# Patient Record
Sex: Female | Born: 1935 | Hispanic: No | Marital: Married
Health system: Southern US, Community
[De-identification: ages and names within clinical notes are randomized; demographics above are authoritative.]

---

## 2003-11-23 ENCOUNTER — Ambulatory Visit: Payer: Self-pay | Admitting: Family Medicine

## 2004-02-20 ENCOUNTER — Ambulatory Visit: Payer: Self-pay | Admitting: Internal Medicine

## 2004-07-05 ENCOUNTER — Other Ambulatory Visit: Payer: Self-pay

## 2004-07-05 ENCOUNTER — Emergency Department: Payer: Self-pay | Admitting: Internal Medicine

## 2004-12-26 ENCOUNTER — Ambulatory Visit: Payer: Self-pay | Admitting: Family Medicine

## 2005-12-30 ENCOUNTER — Ambulatory Visit: Payer: Self-pay | Admitting: Family Medicine

## 2006-06-01 ENCOUNTER — Emergency Department: Payer: Self-pay | Admitting: Emergency Medicine

## 2007-12-14 ENCOUNTER — Ambulatory Visit: Payer: Self-pay | Admitting: Family Medicine

## 2008-07-31 ENCOUNTER — Emergency Department: Payer: Self-pay | Admitting: Emergency Medicine

## 2008-08-05 ENCOUNTER — Emergency Department: Payer: Self-pay | Admitting: Emergency Medicine

## 2008-09-19 ENCOUNTER — Inpatient Hospital Stay: Payer: Self-pay | Admitting: Internal Medicine

## 2008-09-19 ENCOUNTER — Ambulatory Visit: Payer: Self-pay | Admitting: Internal Medicine

## 2008-11-14 ENCOUNTER — Encounter: Payer: Self-pay | Admitting: Unknown Physician Specialty

## 2008-11-17 ENCOUNTER — Encounter: Payer: Self-pay | Admitting: Unknown Physician Specialty

## 2008-12-18 ENCOUNTER — Encounter: Payer: Self-pay | Admitting: Unknown Physician Specialty

## 2009-01-17 ENCOUNTER — Encounter: Payer: Self-pay | Admitting: Unknown Physician Specialty

## 2009-02-17 ENCOUNTER — Encounter: Payer: Self-pay | Admitting: Unknown Physician Specialty

## 2009-03-20 ENCOUNTER — Encounter: Payer: Self-pay | Admitting: Unknown Physician Specialty

## 2010-06-18 IMAGING — CR DG CHEST 2V
1 series · 2 of 2 positions shown · non-contrast
Comparison: none

REASON FOR EXAM: hypoxia
COMMENTS:

PROCEDURE:     DXR - DXR CHEST PA (OR AP) AND LATERAL  - September 22, 2008  [DATE]
RESULT:     Comparison: 09/19/2008

[Series 1: view not recorded · 0.17mm/px · 2 of 2 slices shown]
[im 1/2]
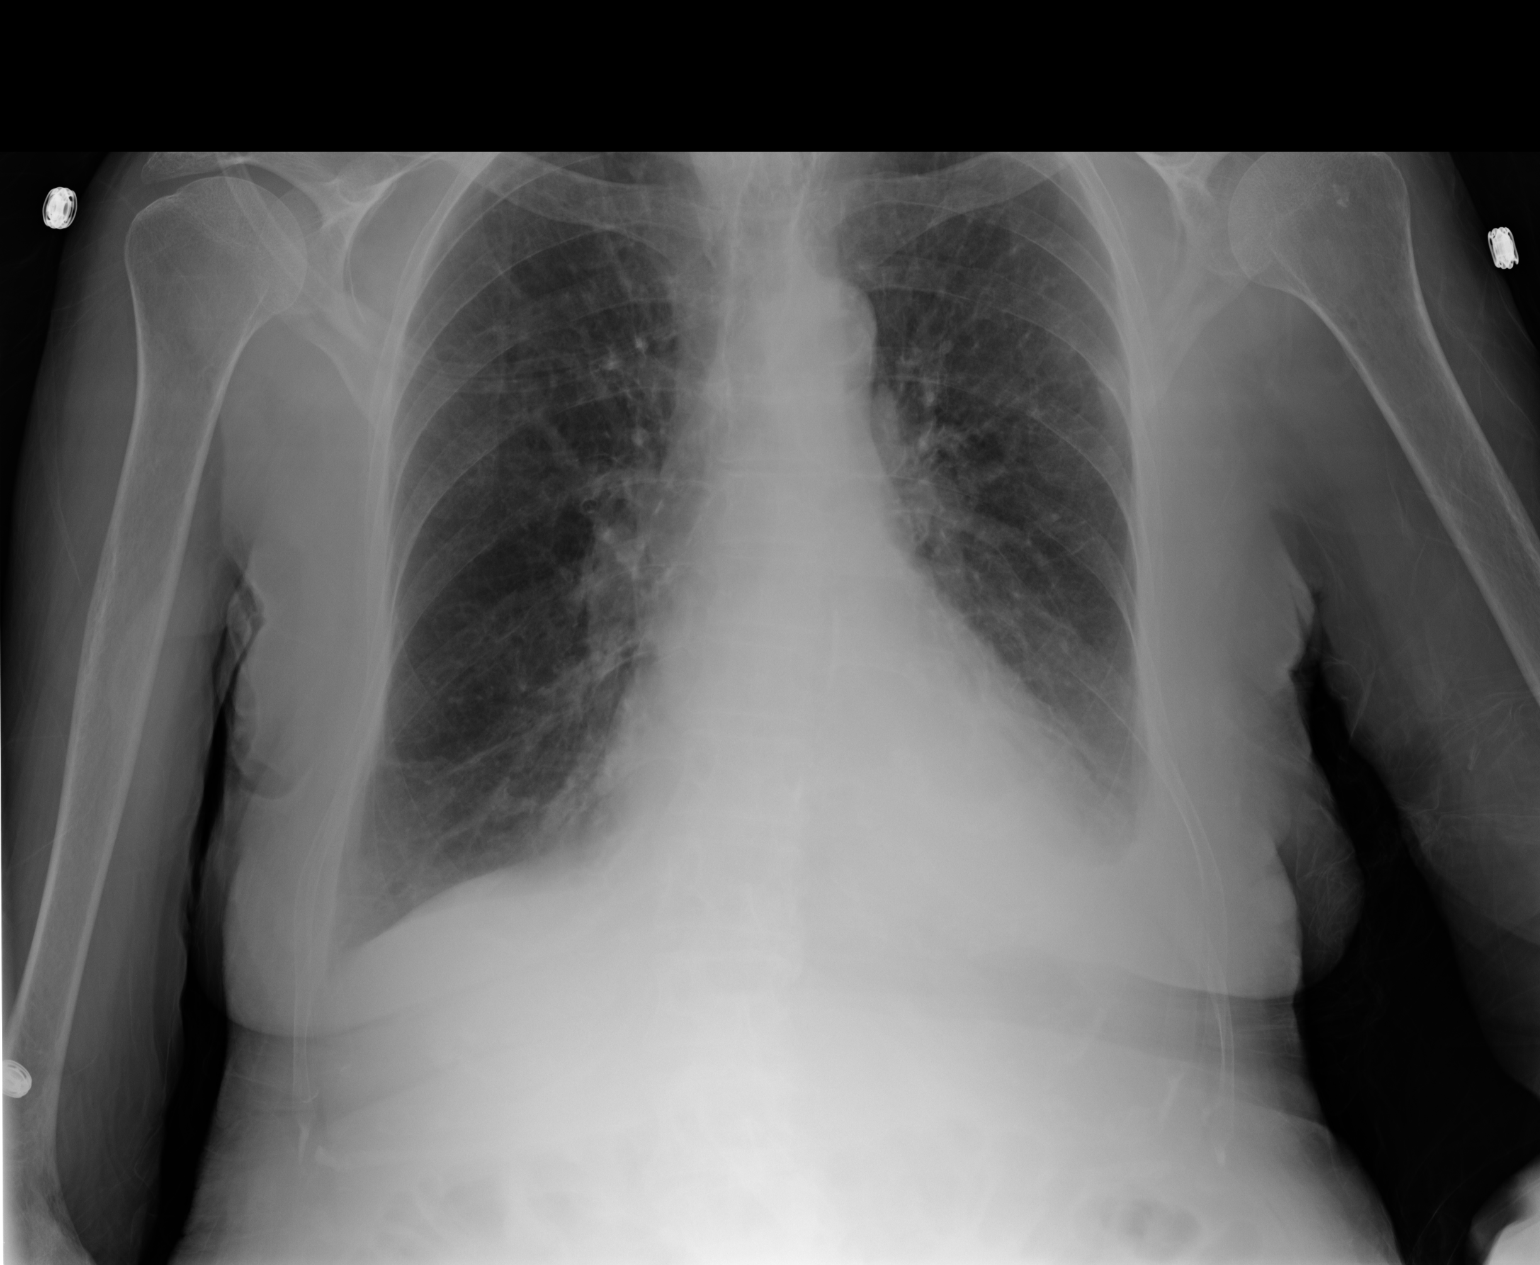
[im 2/2]
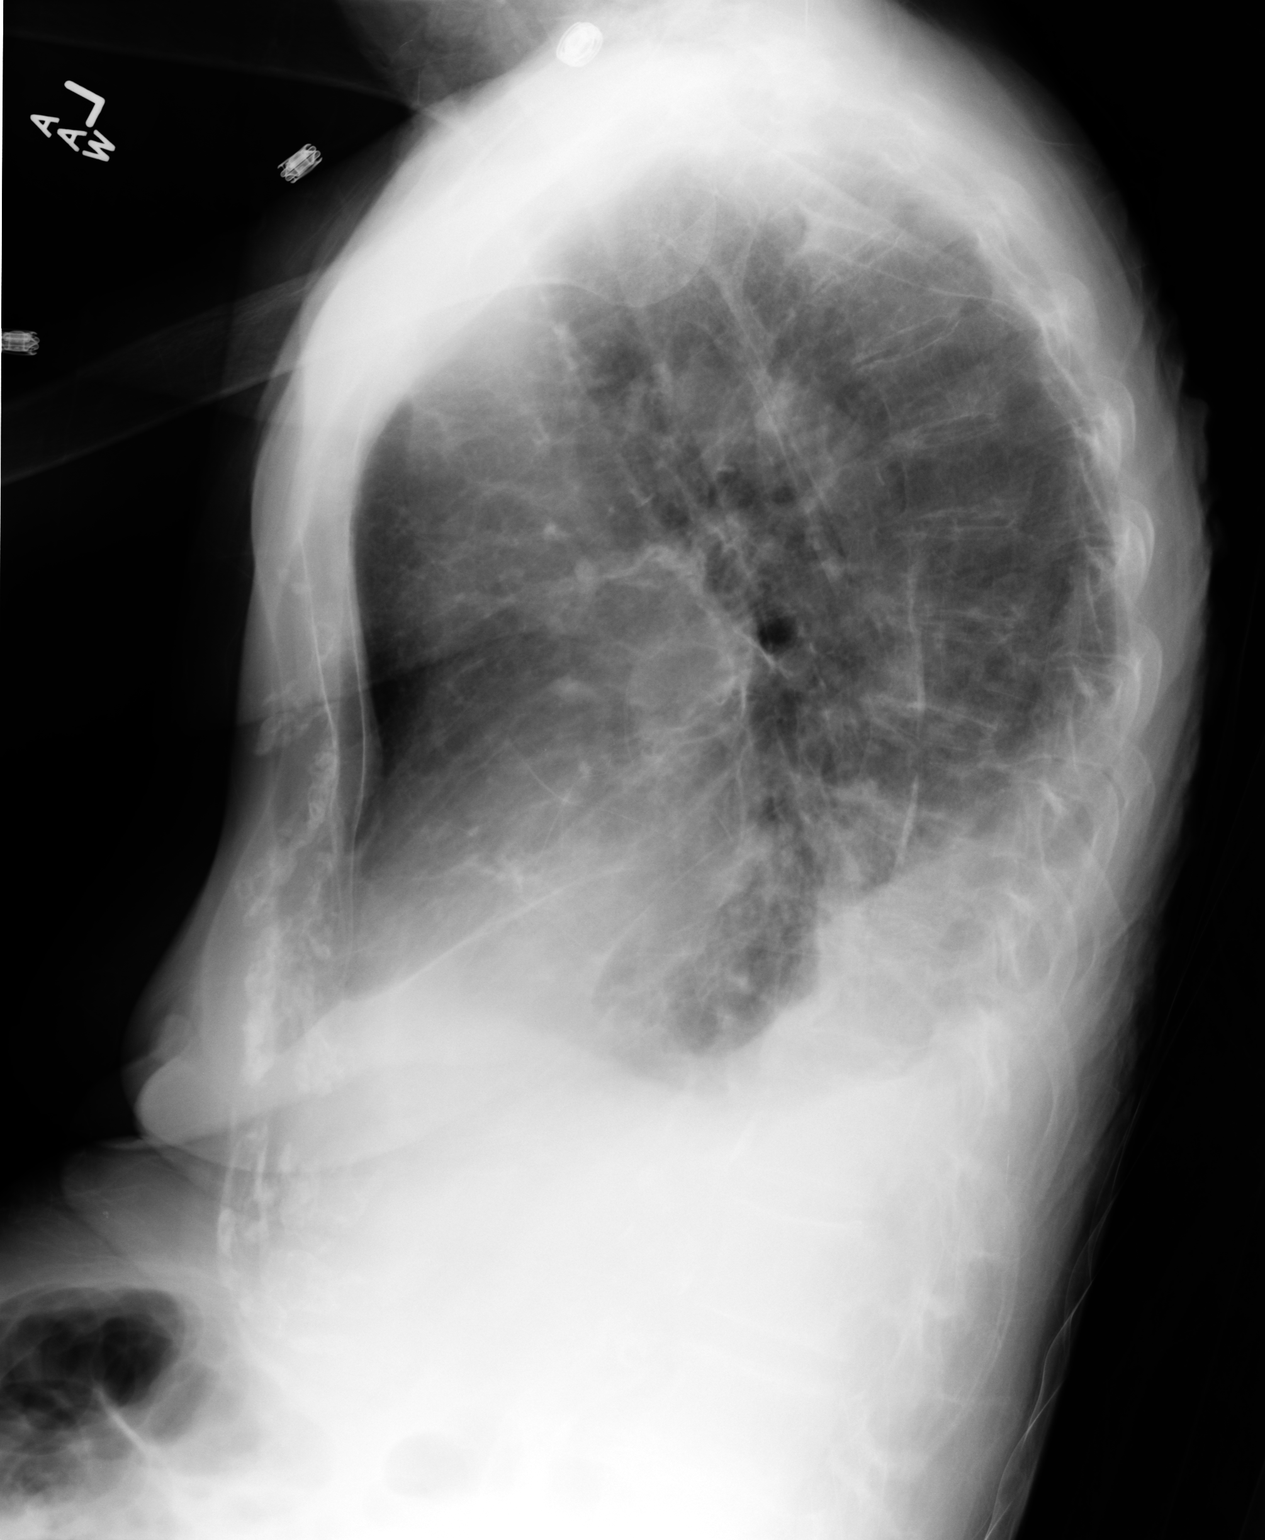

[2 of 2 positions shown; findings below may reference images not displayed]

FINDINGS: PA and lateral chest radiographs are provided. There is a small left pleural
effusion. There is no focal parenchymal opacity. The lungs are hyperinflated
likely secondary to COPD. There is no pneumothorax. The heart and
mediastinum are stable. The osseous structures are unremarkable.
IMPRESSION: Small left pleural effusion.

COPD.

## 2010-06-19 IMAGING — US US RENAL KIDNEY
1 series · 17 of 25 positions shown · non-contrast
Comparison: none

REASON FOR EXAM: left renal mass
COMMENTS:

PROCEDURE:     US  - US KIDNEY  - September 23, 2008  [DATE]
RESULT:     Comparison: None
INDICATION: Left renal mass

[Series 1: us renal kidney · 17 of 33 slices shown]
[im 1/33]
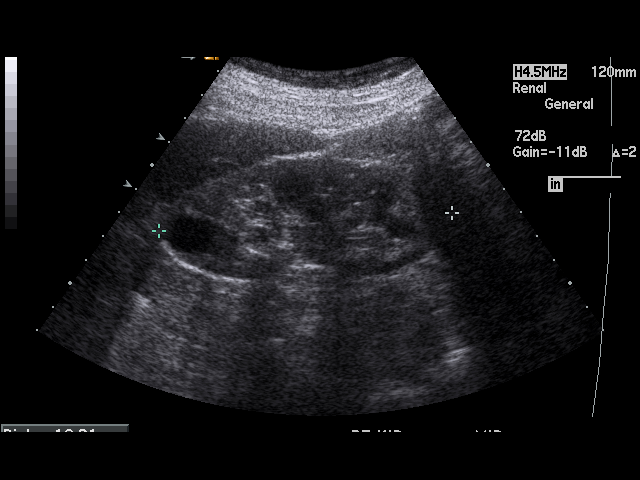
[im 3/33]
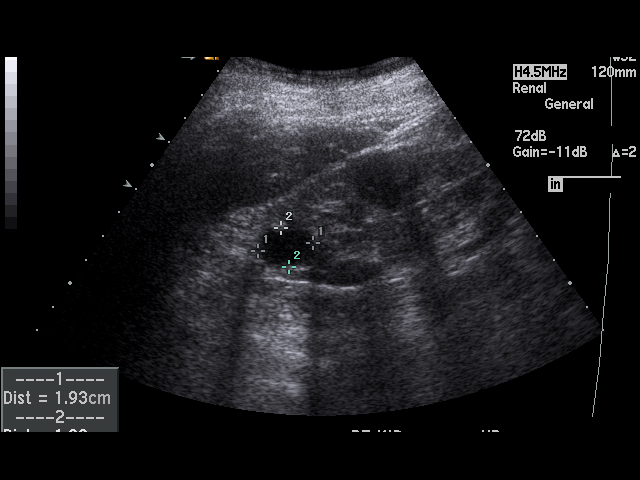
[im 5/33]
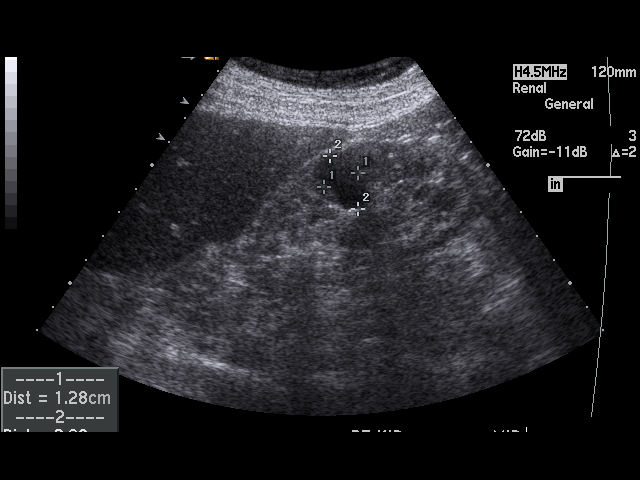
[im 7/33]
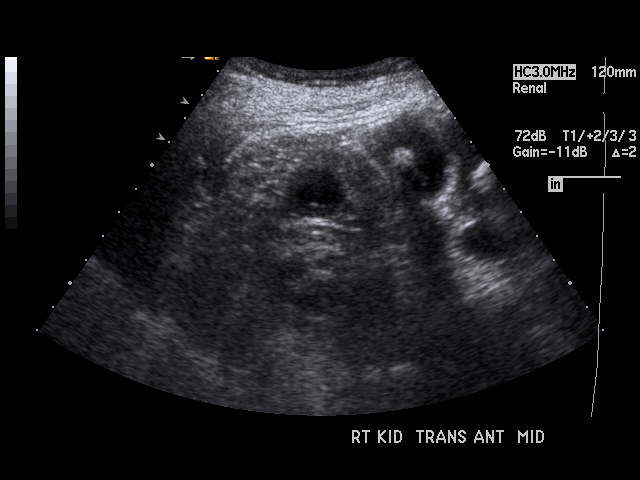
[im 9/33]
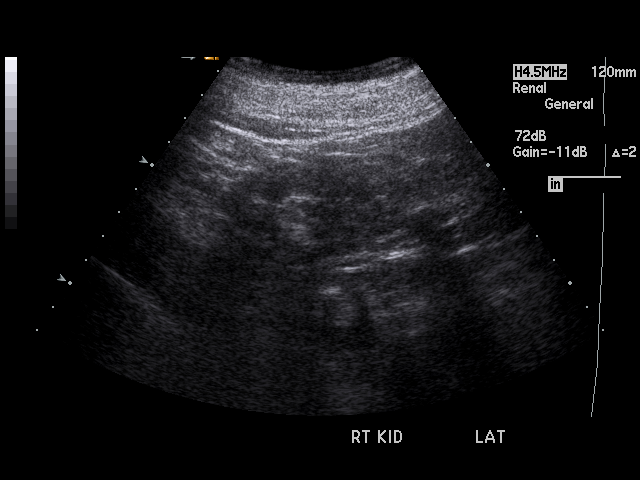
[im 11/33]
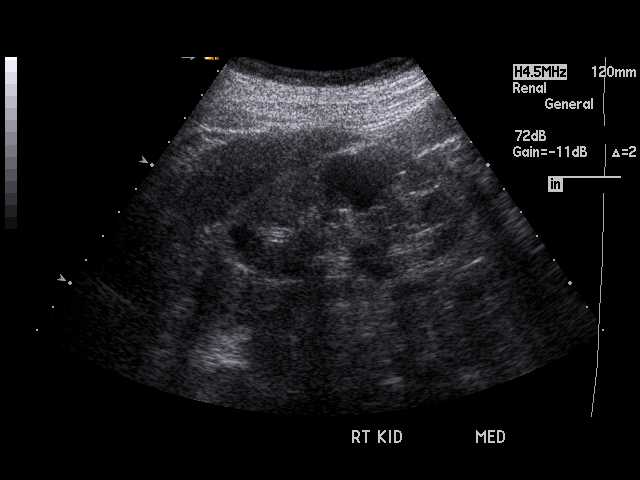
[im 13/33]
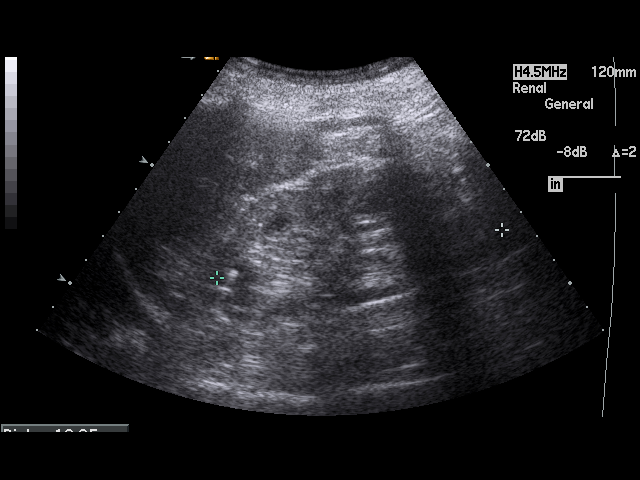
[im 15/33]
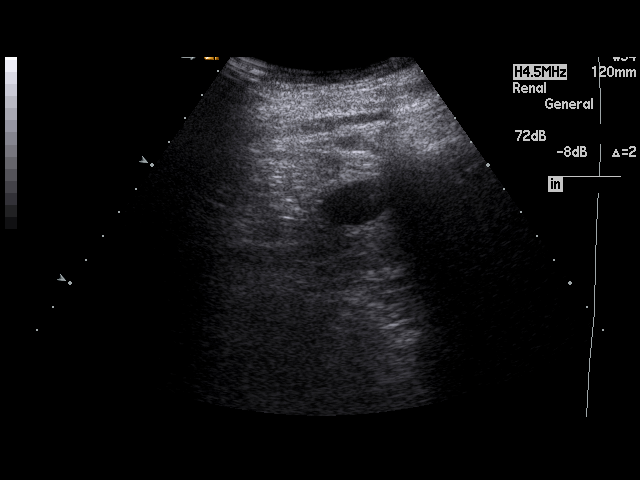
[im 17/33]
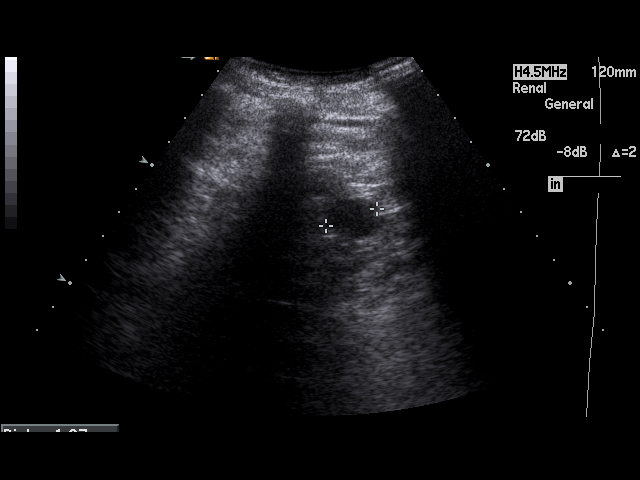
[im 18/33]
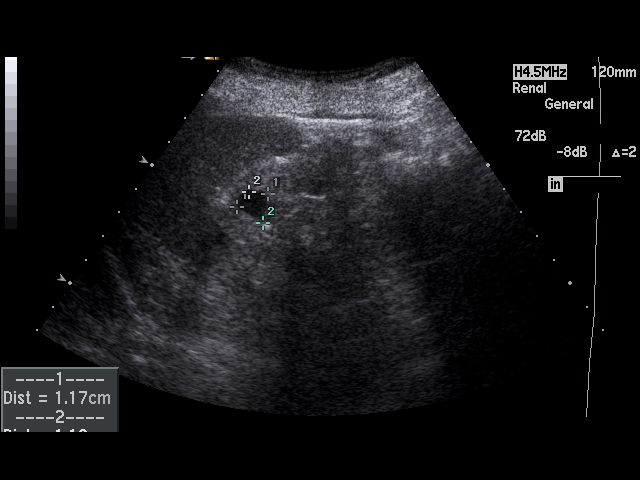
[im 21/33]
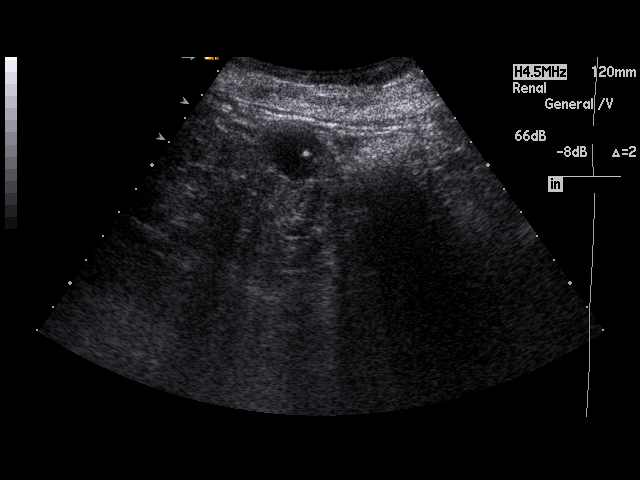
[im 22/33]
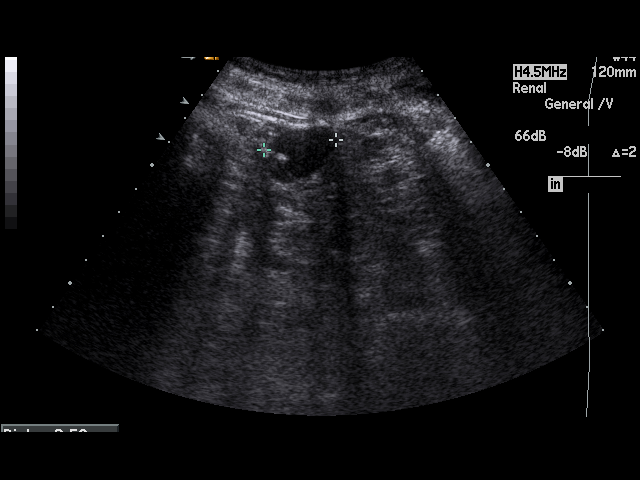
[im 25/33]
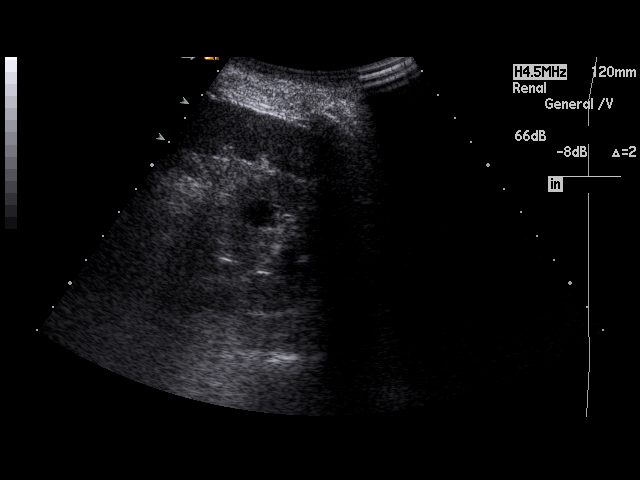
[im 26/33]
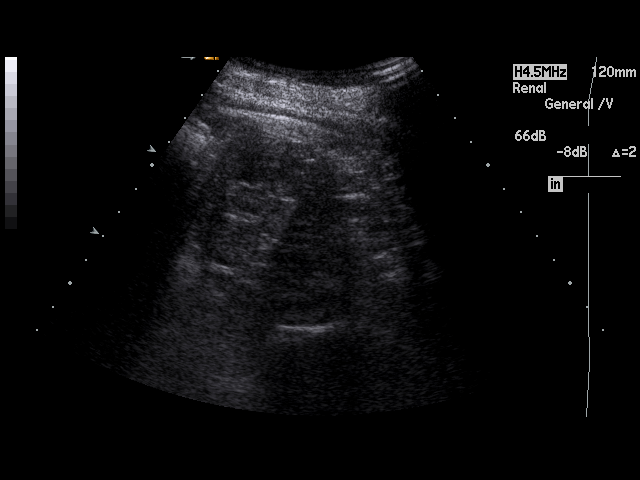
[im 29/33]
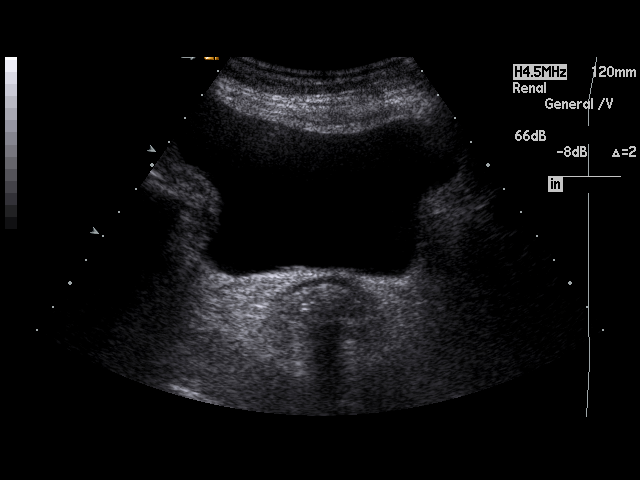
[im 30/33]
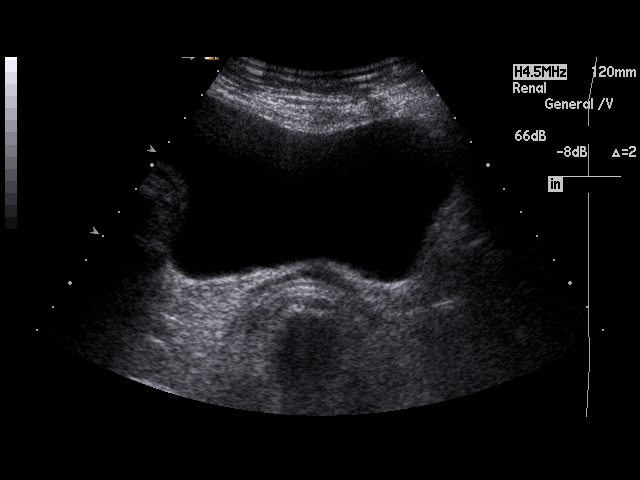
[im 33/33]
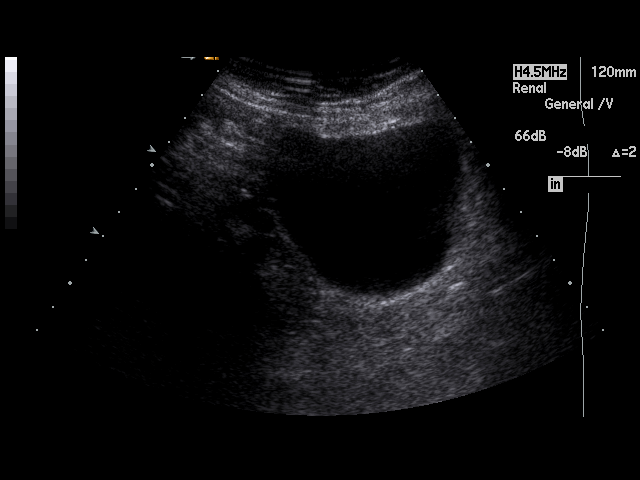

[17 of 25 positions shown; findings below may reference images not displayed]

Technique and findings: Multiple gray-scale and color doppler images of the
kidneys were obtained.

The right kidney measures 10.2 cm, the left kidney measures 10.1 cm. The
kidneys are normal in echogenicity. There is no hydronephrosis or echogenic
foci. There are bilateral anechoic renal masses with the largest on the
right measuring 1.3 x 2.1 x 2.1 cm and the largest on the left measuring
x 1.5 x 2.5 cm. The largest right renal mass demonstrates and echogenicity
with increased through transmission most consistent with a simple cyst. The
largest left renal mass demonstrates a punctate area of hyper echogenicity
which may represent a calcification. The largest left renal mass is
hypoechoic with internal echoes. No free fluid in the region of the renal
fossa.
IMPRESSION: 1. Multiple bilateral renal cysts.
2. There is a mildly complex left renal mass in the anterior mid pole of the
left kidney. Recommend further evaluation with a multiphasic CT of the

## 2010-11-12 ENCOUNTER — Emergency Department: Payer: Self-pay | Admitting: Emergency Medicine

## 2012-09-05 ENCOUNTER — Emergency Department: Payer: Self-pay | Admitting: Internal Medicine

## 2012-09-05 LAB — COMPREHENSIVE METABOLIC PANEL
Alkaline Phosphatase: 74 U/L (ref 50–136)
Anion Gap: 3 — ABNORMAL LOW (ref 7–16)
Bilirubin,Total: 0.4 mg/dL (ref 0.2–1.0)
Creatinine: 2.34 mg/dL — ABNORMAL HIGH (ref 0.60–1.30)
EGFR (African American): 21 — ABNORMAL LOW
Glucose: 115 mg/dL — ABNORMAL HIGH (ref 65–99)
Potassium: 3.5 mmol/L (ref 3.5–5.1)
SGPT (ALT): 36 U/L (ref 12–78)
Total Protein: 7.2 g/dL (ref 6.4–8.2)

## 2012-09-05 LAB — CBC
HGB: 11 g/dL — ABNORMAL LOW (ref 12.0–16.0)
MCH: 30.4 pg (ref 26.0–34.0)
MCHC: 33.2 g/dL (ref 32.0–36.0)
MCV: 92 fL (ref 80–100)
Platelet: 317 10*3/uL (ref 150–440)
RBC: 3.62 10*6/uL — ABNORMAL LOW (ref 3.80–5.20)
RDW: 14.2 % (ref 11.5–14.5)
WBC: 8.3 10*3/uL (ref 3.6–11.0)

## 2012-09-05 LAB — PRO B NATRIURETIC PEPTIDE: B-Type Natriuretic Peptide: 10555 pg/mL — ABNORMAL HIGH (ref 0–450)

## 2012-09-08 ENCOUNTER — Inpatient Hospital Stay: Payer: Self-pay | Admitting: Internal Medicine

## 2012-09-08 LAB — COMPREHENSIVE METABOLIC PANEL
Albumin: 3.4 g/dL (ref 3.4–5.0)
Alkaline Phosphatase: 63 U/L (ref 50–136)
Anion Gap: 5 — ABNORMAL LOW (ref 7–16)
BUN: 39 mg/dL — ABNORMAL HIGH (ref 7–18)
Bilirubin,Total: 0.3 mg/dL (ref 0.2–1.0)
Calcium, Total: 8.5 mg/dL (ref 8.5–10.1)
Co2: 29 mmol/L (ref 21–32)
Creatinine: 3.12 mg/dL — ABNORMAL HIGH (ref 0.60–1.30)
EGFR (African American): 15 — ABNORMAL LOW
EGFR (Non-African Amer.): 13 — ABNORMAL LOW
Glucose: 125 mg/dL — ABNORMAL HIGH (ref 65–99)
Potassium: 4.2 mmol/L (ref 3.5–5.1)
SGOT(AST): 34 U/L (ref 15–37)
Sodium: 133 mmol/L — ABNORMAL LOW (ref 136–145)
Total Protein: 6.6 g/dL (ref 6.4–8.2)

## 2012-09-08 LAB — URINALYSIS, COMPLETE
Bilirubin,UR: NEGATIVE
Blood: NEGATIVE
Hyaline Cast: 5
Leukocyte Esterase: NEGATIVE
WBC UR: 3 /HPF (ref 0–5)

## 2012-09-08 LAB — CBC
HCT: 31.7 % — ABNORMAL LOW (ref 35.0–47.0)
HGB: 10.6 g/dL — ABNORMAL LOW (ref 12.0–16.0)
MCHC: 33.3 g/dL (ref 32.0–36.0)
Platelet: 250 10*3/uL (ref 150–440)
RBC: 3.4 10*6/uL — ABNORMAL LOW (ref 3.80–5.20)

## 2012-09-08 LAB — TROPONIN I: Troponin-I: <0.02 ng/mL

## 2012-09-08 LAB — CK TOTAL AND CKMB (NOT AT ARMC)
CK, Total: 52 U/L (ref 21–215)
CK-MB: 0.8 ng/mL (ref 0.5–3.6)

## 2012-09-08 LAB — PRO B NATRIURETIC PEPTIDE: B-Type Natriuretic Peptide: 11618 pg/mL — ABNORMAL HIGH (ref 0–450)

## 2012-09-09 DIAGNOSIS — I359 Nonrheumatic aortic valve disorder, unspecified: Secondary | ICD-10-CM

## 2012-09-09 LAB — CBC WITH DIFFERENTIAL/PLATELET
Basophil %: 0.4 %
HCT: 26.3 % — ABNORMAL LOW (ref 35.0–47.0)
HGB: 9.1 g/dL — ABNORMAL LOW (ref 12.0–16.0)
Lymphocyte #: 0.7 10*3/uL — ABNORMAL LOW (ref 1.0–3.6)
Lymphocyte %: 10.6 %
MCH: 32.2 pg (ref 26.0–34.0)
Monocyte #: 0.4 x10 3/mm (ref 0.2–0.9)
Neutrophil #: 5.9 10*3/uL (ref 1.4–6.5)
Neutrophil %: 83.5 %
Platelet: 194 10*3/uL (ref 150–440)
WBC: 7 10*3/uL (ref 3.6–11.0)

## 2012-09-09 LAB — BASIC METABOLIC PANEL
Anion Gap: 7 (ref 7–16)
BUN: 41 mg/dL — ABNORMAL HIGH (ref 7–18)
Chloride: 100 mmol/L (ref 98–107)
Co2: 29 mmol/L (ref 21–32)
EGFR (African American): 16 — ABNORMAL LOW
EGFR (Non-African Amer.): 14 — ABNORMAL LOW
Glucose: 110 mg/dL — ABNORMAL HIGH (ref 65–99)
Osmolality: 283 (ref 275–301)
Potassium: 4.2 mmol/L (ref 3.5–5.1)
Sodium: 136 mmol/L (ref 136–145)

## 2012-09-10 LAB — BASIC METABOLIC PANEL
Anion Gap: 8 (ref 7–16)
BUN: 46 mg/dL — ABNORMAL HIGH (ref 7–18)
Calcium, Total: 8.2 mg/dL — ABNORMAL LOW (ref 8.5–10.1)
Chloride: 101 mmol/L (ref 98–107)
Co2: 28 mmol/L (ref 21–32)
Creatinine: 3.07 mg/dL — ABNORMAL HIGH (ref 0.60–1.30)
EGFR (African American): 15 — ABNORMAL LOW
Osmolality: 286 (ref 275–301)
Potassium: 4 mmol/L (ref 3.5–5.1)
Sodium: 137 mmol/L (ref 136–145)

## 2012-09-10 LAB — CBC WITH DIFFERENTIAL/PLATELET
Basophil #: 0 10*3/uL (ref 0.0–0.1)
Basophil %: 0.4 %
Eosinophil #: 0 10*3/uL (ref 0.0–0.7)
Eosinophil %: 0.1 %
Lymphocyte #: 0.5 10*3/uL — ABNORMAL LOW (ref 1.0–3.6)
Lymphocyte %: 8.3 %
MCHC: 34.4 g/dL (ref 32.0–36.0)
Neutrophil #: 4.7 10*3/uL (ref 1.4–6.5)
Neutrophil %: 85.4 %
RBC: 2.68 10*6/uL — ABNORMAL LOW (ref 3.80–5.20)
RDW: 13.9 % (ref 11.5–14.5)
WBC: 5.5 10*3/uL (ref 3.6–11.0)

## 2012-09-10 LAB — PROTEIN / CREATININE RATIO, URINE
Creatinine, Urine: 112.1 mg/dL (ref 30.0–125.0)
Protein, Random Urine: 108 mg/dL — ABNORMAL HIGH (ref 0–12)

## 2012-09-11 LAB — BASIC METABOLIC PANEL
Anion Gap: 6 — ABNORMAL LOW (ref 7–16)
Co2: 29 mmol/L (ref 21–32)
Creatinine: 2.81 mg/dL — ABNORMAL HIGH (ref 0.60–1.30)
EGFR (African American): 17 — ABNORMAL LOW
Sodium: 137 mmol/L (ref 136–145)

## 2012-09-11 LAB — FERRITIN: Ferritin (ARMC): 56 ng/mL (ref 8–388)

## 2012-09-11 LAB — IRON AND TIBC: Iron Saturation: 23 %

## 2012-09-12 LAB — BASIC METABOLIC PANEL
Anion Gap: 1 — ABNORMAL LOW (ref 7–16)
Calcium, Total: 8.6 mg/dL (ref 8.5–10.1)
Co2: 32 mmol/L (ref 21–32)
Creatinine: 2.47 mg/dL — ABNORMAL HIGH (ref 0.60–1.30)
Glucose: 139 mg/dL — ABNORMAL HIGH (ref 65–99)
Potassium: 3.9 mmol/L (ref 3.5–5.1)
Sodium: 136 mmol/L (ref 136–145)

## 2012-09-13 LAB — CBC WITH DIFFERENTIAL/PLATELET
Eosinophil %: 1.7 %
HCT: 26.5 % — ABNORMAL LOW (ref 35.0–47.0)
HGB: 9.2 g/dL — ABNORMAL LOW (ref 12.0–16.0)
Lymphocyte #: 1.1 10*3/uL (ref 1.0–3.6)
Lymphocyte %: 14.4 %
MCV: 91 fL (ref 80–100)
Monocyte #: 0.7 x10 3/mm (ref 0.2–0.9)
Monocyte %: 8.8 %
Neutrophil #: 5.6 10*3/uL (ref 1.4–6.5)
Neutrophil %: 74.4 %
Platelet: 230 10*3/uL (ref 150–440)
RBC: 2.91 10*6/uL — ABNORMAL LOW (ref 3.80–5.20)

## 2012-09-13 LAB — BASIC METABOLIC PANEL
Anion Gap: 5 — ABNORMAL LOW (ref 7–16)
Calcium, Total: 8.8 mg/dL (ref 8.5–10.1)
Chloride: 104 mmol/L (ref 98–107)
Co2: 30 mmol/L (ref 21–32)
EGFR (African American): 22 — ABNORMAL LOW
EGFR (Non-African Amer.): 19 — ABNORMAL LOW
Sodium: 139 mmol/L (ref 136–145)

## 2012-09-17 ENCOUNTER — Ambulatory Visit: Payer: Self-pay | Admitting: Internal Medicine

## 2012-09-21 ENCOUNTER — Inpatient Hospital Stay: Payer: Self-pay | Admitting: Internal Medicine

## 2012-09-21 LAB — BASIC METABOLIC PANEL
Anion Gap: 8 (ref 7–16)
Anion Gap: 11 (ref 7–16)
BUN: 71 mg/dL — ABNORMAL HIGH (ref 7–18)
Calcium, Total: 8.3 mg/dL — ABNORMAL LOW (ref 8.5–10.1)
Calcium, Total: 8.2 mg/dL — ABNORMAL LOW (ref 8.5–10.1)
Chloride: 93 mmol/L — ABNORMAL LOW (ref 98–107)
Co2: 23 mmol/L (ref 21–32)
Creatinine: 5.14 mg/dL — ABNORMAL HIGH (ref 0.60–1.30)
EGFR (African American): 9 — ABNORMAL LOW
EGFR (African American): 8 — ABNORMAL LOW
EGFR (Non-African Amer.): 7 — ABNORMAL LOW
EGFR (Non-African Amer.): 7 — ABNORMAL LOW
Glucose: 110 mg/dL — ABNORMAL HIGH (ref 65–99)
Glucose: 116 mg/dL — ABNORMAL HIGH (ref 65–99)
Potassium: 5.2 mmol/L — ABNORMAL HIGH (ref 3.5–5.1)
Potassium: 5.3 mmol/L — ABNORMAL HIGH (ref 3.5–5.1)
Sodium: 126 mmol/L — ABNORMAL LOW (ref 136–145)
Sodium: 127 mmol/L — ABNORMAL LOW (ref 136–145)

## 2012-09-21 LAB — CBC
MCH: 31.8 pg (ref 26.0–34.0)
Platelet: 301 10*3/uL (ref 150–440)
WBC: 10.2 10*3/uL (ref 3.6–11.0)

## 2012-09-21 LAB — CK TOTAL AND CKMB (NOT AT ARMC)
CK, Total: 32 U/L (ref 21–215)
CK, Total: 32 U/L (ref 21–215)
CK, Total: 39 U/L (ref 21–215)
CK-MB: 1.3 ng/mL (ref 0.5–3.6)
CK-MB: 1 ng/mL (ref 0.5–3.6)
CK-MB: 2.4 ng/mL (ref 0.5–3.6)

## 2012-09-21 LAB — TROPONIN I
Troponin-I: <0.02 ng/mL
Troponin-I: 0.03 ng/mL

## 2012-09-21 LAB — PRO B NATRIURETIC PEPTIDE: B-Type Natriuretic Peptide: 36168 pg/mL — ABNORMAL HIGH (ref 0–450)

## 2012-09-21 LAB — POTASSIUM: Potassium: 4.6 mmol/L (ref 3.5–5.1)

## 2012-09-22 LAB — CBC WITH DIFFERENTIAL/PLATELET
Basophil #: 0 10*3/uL (ref 0.0–0.1)
Lymphocyte #: 0.4 10*3/uL — ABNORMAL LOW (ref 1.0–3.6)
Lymphocyte %: 6.3 %
MCHC: 35.2 g/dL (ref 32.0–36.0)
MCV: 91 fL (ref 80–100)
Monocyte #: 0.4 x10 3/mm (ref 0.2–0.9)
Platelet: 262 10*3/uL (ref 150–440)
RDW: 14 % (ref 11.5–14.5)

## 2012-09-22 LAB — BASIC METABOLIC PANEL
Calcium, Total: 7.7 mg/dL — ABNORMAL LOW (ref 8.5–10.1)
Co2: 29 mmol/L (ref 21–32)
Creatinine: 5.52 mg/dL — ABNORMAL HIGH (ref 0.60–1.30)
Glucose: 91 mg/dL (ref 65–99)
Potassium: 3.9 mmol/L (ref 3.5–5.1)

## 2012-10-18 ENCOUNTER — Ambulatory Visit: Payer: Self-pay | Admitting: Internal Medicine

## 2012-10-18 DEATH — deceased

## 2012-11-05 LAB — UR PROT ELECTROPHORESIS, URINE RANDOM

## 2014-06-04 IMAGING — CR DG CHEST 2V
1 series · 2 of 2 positions shown · non-contrast
Comparison: none

REASON FOR EXAM: weakness
COMMENTS:   LMP: Post-Menopausal

[Series 1: ap · 0.17mm/px · 2 of 2 slices shown]
[im 1/2]
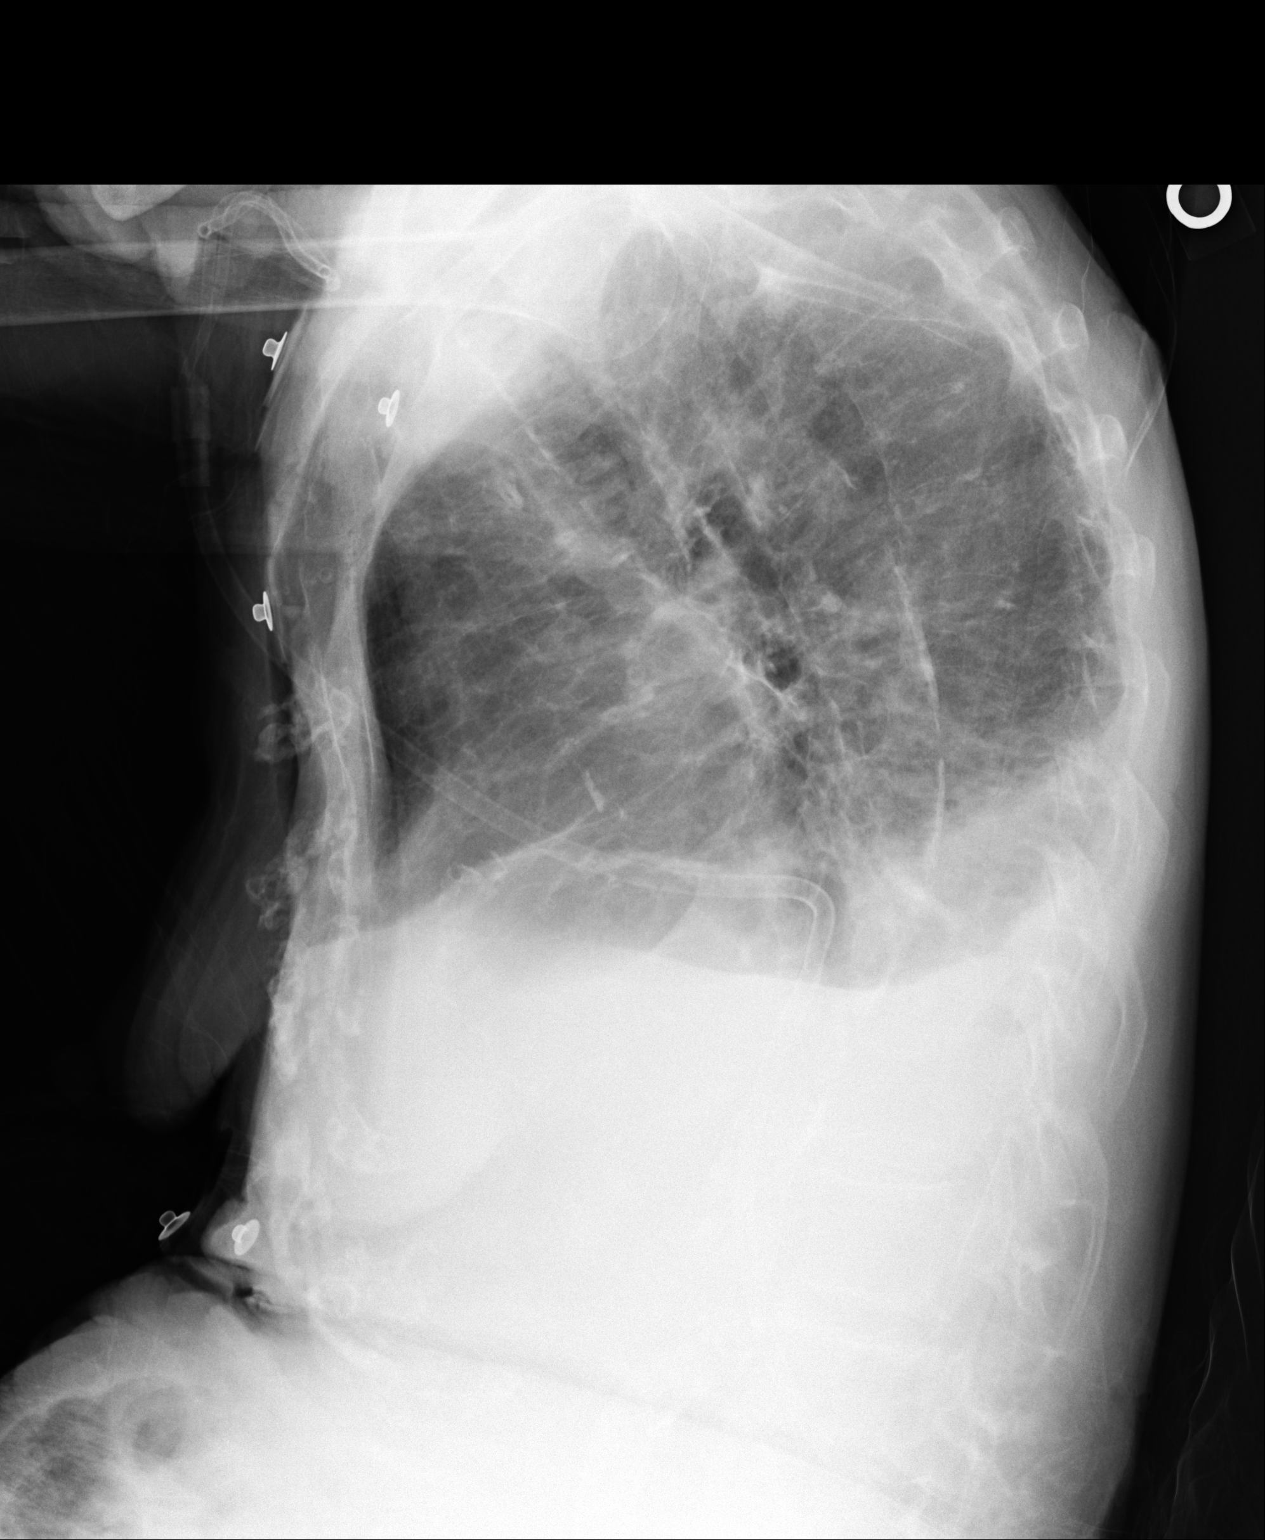
[im 2/2]
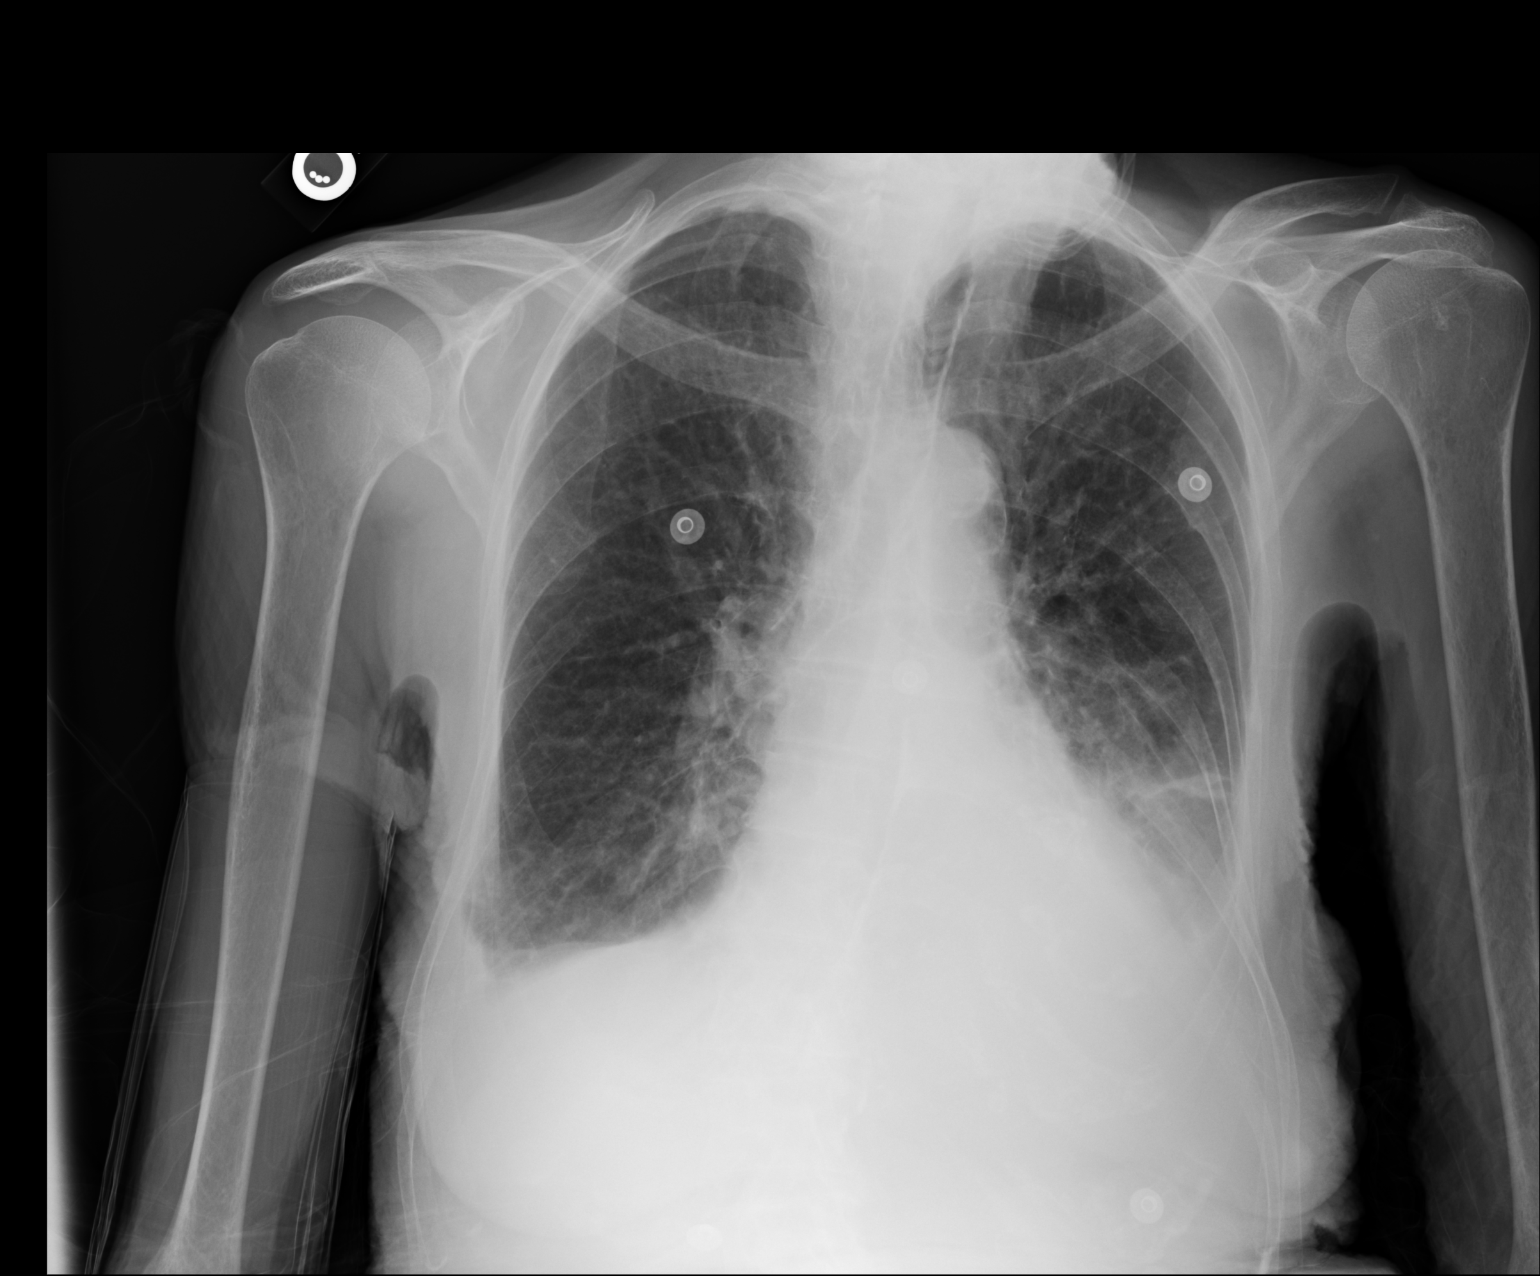

[2 of 2 positions shown; findings below may reference images not displayed]

PROCEDURE:     DXR - DXR CHEST PA (OR AP) AND LATERAL  - September 08, 2012  [DATE]

RESULT:     Comparison is made to the study September 05, 2012.

The lungs are reasonably well inflated. There are bilateral pleural
effusions which are more conspicuous than in the past. The pulmonary
vascularity is more engorged. The cardiac silhouette is mildly enlarged
though stable. The bones are diffusely osteopenic.
IMPRESSION: The findings are consistent with increased pulmonary
interstitial edema as well as an increase in the bilateral pleural effusions
likely secondary to CHF.

[REDACTED]

## 2014-06-07 IMAGING — CR DG CHEST 2V
1 series · 2 of 2 positions shown · non-contrast
Comparison: none

REASON FOR EXAM: hypoxia, CHF
COMMENTS:

PROCEDURE:     DXR - DXR CHEST PA (OR AP) AND LATERAL  - September 11, 2012  [DATE]
RESULT:     Comparison is made to the prior study dated 09/08/2012.

[Series 1: ap · 0.17mm/px · 2 of 2 slices shown]
[im 1/2]
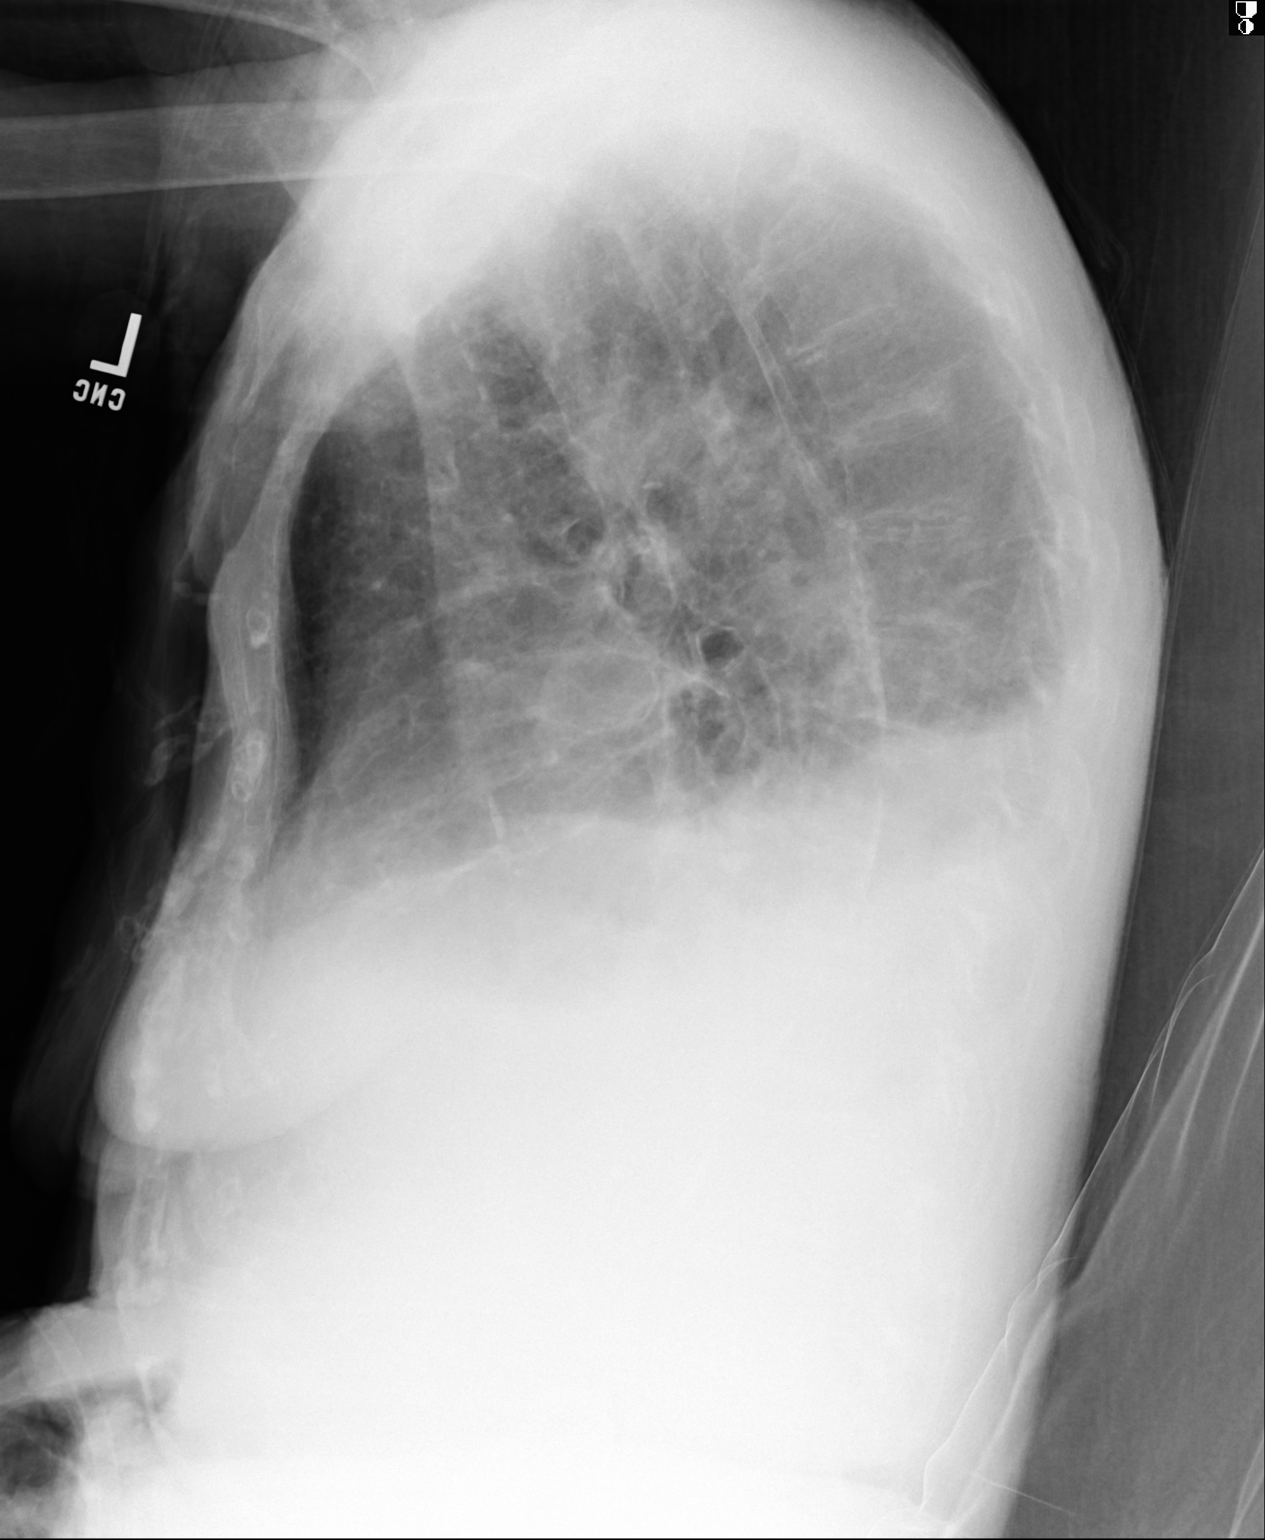
[im 2/2]
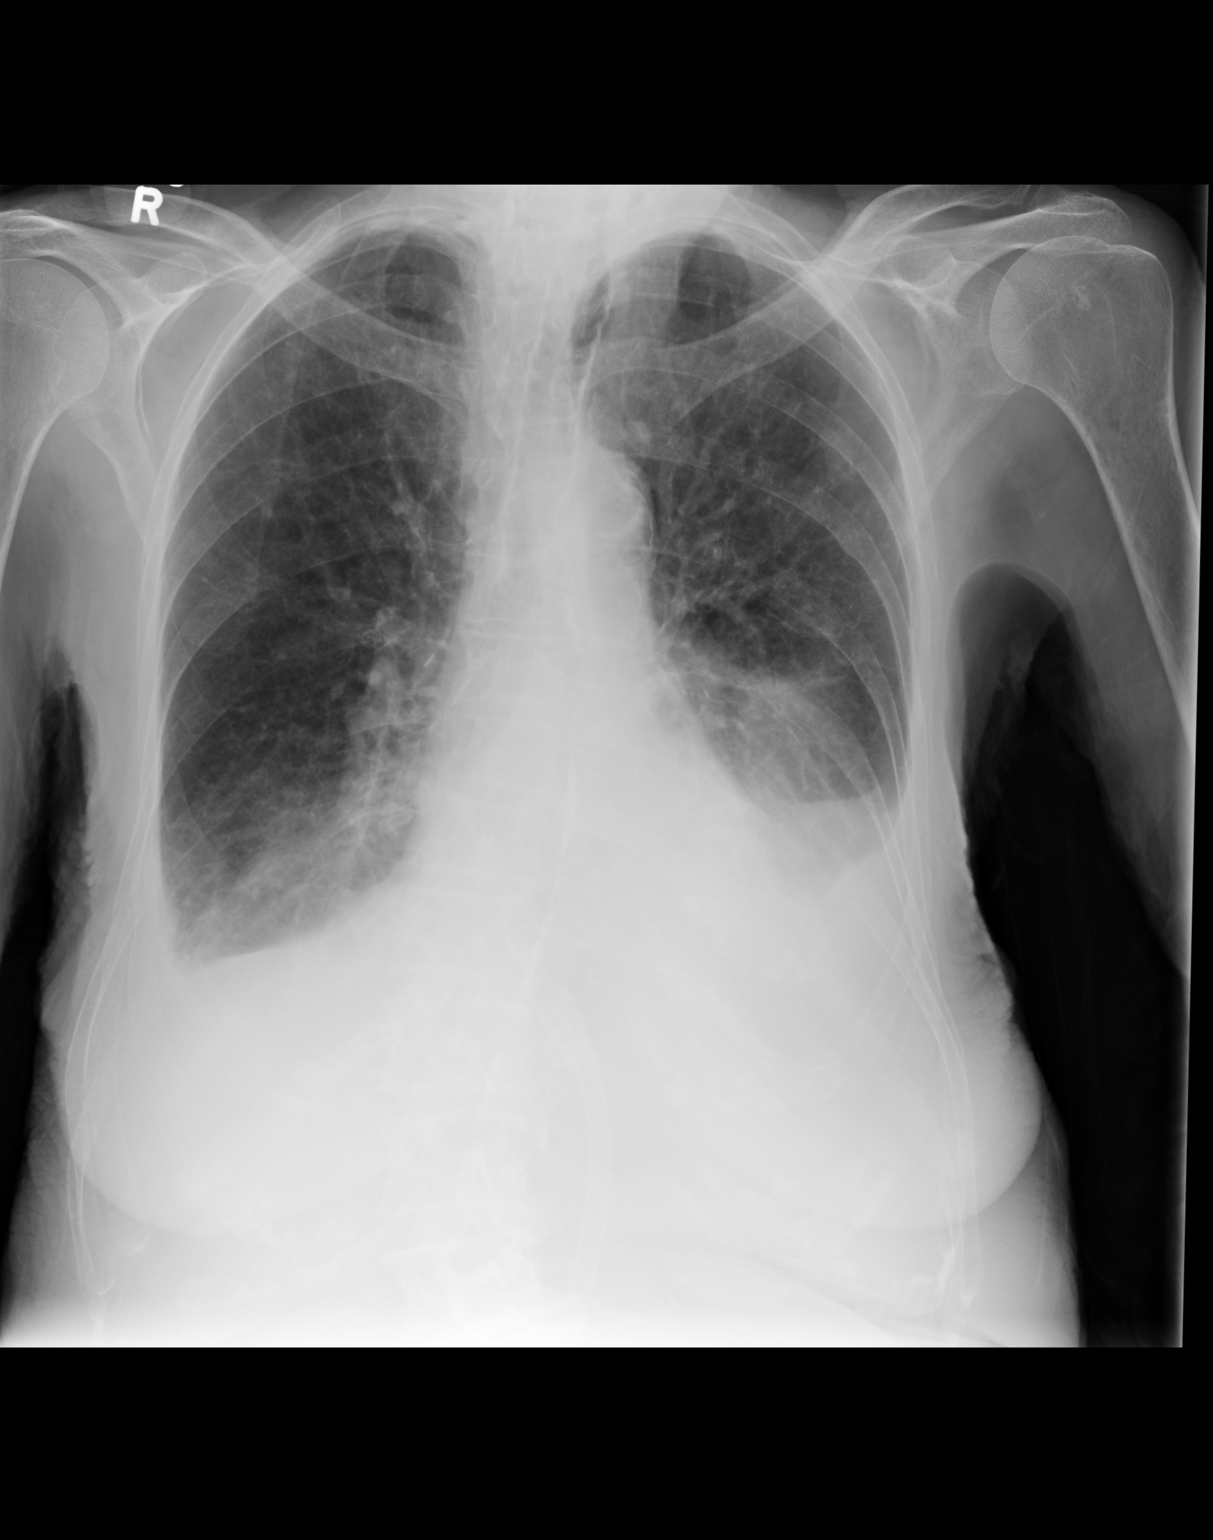

[2 of 2 positions shown; findings below may reference images not displayed]

FINDINGS: There is prominence of the interstitial markings. Areas of
consolidative density project within the lung bases. These areas demonstrate
meniscal apices. There is increased density within the lung bases. The
cardiac silhouette is partially obscured. The visualized bony skeleton is
unremarkable.
IMPRESSION: 1. Interstitial infiltrate likely representing a component of pulmonary
edema. An infectious or inflammatory infiltrate cannot be excluded as well
as underlying component of pulmonary fibrosis.
2. Findings likely reflecting bilateral effusions.
3. Bilateral lower lobe densities. Different considerations are atelectasis
versus infiltrates versus areas of asymmetric edema.

## 2014-06-09 NOTE — H&P (Signed)
PATIENT NAME:  Darlene Adams, Darlene Adams MR#:  [id number] DATE OF BIRTH:  01-Jan-1925  DATE OF ADMISSION:  09/08/2012  PRIMARY CARE PHYSICIAN: Dr. Cay Schillings.   CHIEF COMPLAINT: Generalized weakness, poor p.o. intake and lethargy.   HISTORY OF PRESENT ILLNESS: This is an 81 year old female who presents to the hospital from home today with lethargy, weakness and poor p.o. intake ongoing for the past week or so. The patient apparently was having some lower extremity edema, came into the hospital on July 20th which was 3 days ago, was prescribed some Lasix and discharged home. She was taking the Lasix and her lower extremity edema has significantly improved, although, she has become more weak and lethargic and her p.o. intake still continues to be fairly poor. She came to the ER for further evaluation and was noted to be in worsening acute on chronic renal failure and appears to be dehydrated. Her chest x-ray findings, although are consistent with congestive heart failure, clinically, she is not complaining of shortness of breath, any chest pain, any nausea, vomiting, diarrhea, or any other associated symptoms. Hospitalist services were contacted for further treatment and evaluation.   REVIEW OF SYSTEMS:  CONSTITUTIONAL: No documented fever. No weight gain or weight loss. Positive generalized weakness.  EYES: No blurred or double vision.  ENT: No tinnitus. No postnasal drip. No redness of the oropharynx.  RESPIRATORY: No cough. No wheeze. No hemoptysis.  CARDIOVASCULAR: No chest pain, no orthopnea, no palpitations, no syncope.  GASTROINTESTINAL: No nausea. No vomiting. No diarrhea. No abdominal pain, no melena or hematochezia.  GENITOURINARY: No dysuria or hematuria.  ENDOCRINE: No polyuria or nocturia, heat or cold intolerance.  HEMATOLOGIC: No anemia, no bruising, no bleeding.  INTEGUMENTARY: No rashes. No lesions.  MUSCULOSKELETAL: No arthritis, no swelling. No gout.  NEUROLOGIC: No numbness or tingling. No  ataxia. No seizure-type activity.  PSYCHIATRIC: No anxiety, no insomnia. No ADD.    PAST MEDICAL HISTORY: Consistent with hypertension, hyperlipidemia, history of glaucoma.   ALLERGIES:  SULFA, WHICH CAUSES A RASH.   SOCIAL HISTORY: No smoking. No alcohol abuse. No illicit drug abuse. Lives at home with her daughter.   FAMILY HISTORY: The patient's father died from complications of an MI. Mother died from old age.   CURRENT MEDICATIONS: As follows:  Amlodipine 10 mg daily, aspirin 81 mg daily, Lasix 20 mg b.i.d., lorazepam 1 mg as needed, metoprolol succinate 100 mg daily, potassium 10 mEq daily when she was taking her Lasix, Pravachol 40 mg daily, Travatan 0.004% ophthalmic solution one drop to each affected eye at bedtime Vigamox 0.5% ophthalmic solution q.i.d.   PHYSICAL EXAMINATION ON ADMISSION: Is as follows: VITAL SIGNS: Are noted to be: Temperature is 98.1, pulse 79, respirations 16, blood pressure 119/58, sats 96% on room air.  GENERAL: The patient is a pleasant-appearing female in no apparent distress.  HEENT: Atraumatic, normocephalic. Her extraocular muscles are intact. The pupils are equal and reactive to light. Sclerae anicteric. No conjunctival injection. Dry oral mucosa.  NECK: Supple. There is no jugular venous distention. No bruits, no lymphadenopathy, no thyromegaly.  HEART: Regular rate and rhythm. No murmurs, no rubs, no clicks.  LUNGS: Clear to auscultation bilaterally. Poor air entry bilaterally. Negative use of accessory muscles. No dullness to percussion.  ABDOMEN: Soft, flat, nontender, nondistended. Has good bowel sounds. No hepatosplenomegaly appreciated.  EXTREMITIES: No evidence of any cyanosis or clubbing. She does have trace pedal edema bilaterally, +2 pedal and radial pulses bilaterally.  NEUROLOGIC: The patient is alert, awake,  and oriented x3 with no focal motor or sensory deficits appreciated bilaterally.  SKIN: Moist and warm with no rash appreciated.   LYMPHATIC: There is no cervical or axillary lymphadenopathy.   LABORATORY DATA: Serum glucose 125, BNP of 11,618, BUN 39, creatinine 3.12, sodium 133, potassium 4.2, chloride 99, bicarbonate 29. LFTs are within normal limits. Troponin less than 0.02. White cell count 8.4, hemoglobin 10.6, hematocrit 31.7, platelet count 250.   The patient did have a CT of the head done, which showed no acute intracranial process. The patient also had a chest x-ray done which showed findings consistent with increased pulmonary interstitial edema as well as increasing bilateral pleural effusions, likely secondary to congestive heart failure.   ASSESSMENT AND PLAN: This is an 81 year old female with a history of hypertension, hyperlipidemia, glaucoma, who presents to the hospital due to generalized weakness, poor p.o. intake and noted to be in acute renal failure.   PROBLEMS: 1.  Generalized weakness. The most likely cause of this is dehydration and poor p.o. intake and also worsening renal function. I do not appreciate any evidence of acute infectious source as her chest x-ray shows possible congestive heart failure, but no pneumonia. Her urinalysis is negative. Her CT head is also negative. I will gently hydrate her with IV fluids, follow her clinically. Hold her Lasix for now. I will get physical therapy consult to assess her mobility.  2.  Acute renal failure. This is likely acute on chronic renal failure. The patient's baseline creatinine is around 1.4 to 1.5. It is now up to as high as 3. The patient was apparently getting Lasix for some lower extremity edema; therefore, appears to be dehydrated. Therefore, the renal failure is likely prerenal in nature. I will hydrate her with IV fluids, follow BUN and creatinine, urine output, hold her Lasix for now.  3.  Hypertension. I will continue her metoprolol, continue her Norvasc.  4.  Glaucoma. Continue Travatan eye drops.  5.  Hyperlipidemia. Continue Pravachol.    CODE STATUS: The patient is a DNI/DNR.  TIME SPENT ON ADMISSION: 50 minutes    ____________________________ Rolly PancakeVivek J. Cherlynn KaiserSainani, MD vjs:dp D: 09/08/2012 19:01:11 ET T: 09/08/2012 19:16:39 ET JOB#: [id number]  cc: Rolly PancakeVivek J. Cherlynn KaiserSainani, MD, <Dictator> Houston SirenVIVEK J Lytle Malburg MD ELECTRONICALLY SIGNED 09/18/2012 15:18

## 2014-06-09 NOTE — Consult Note (Signed)
PATIENT NAME:  Darlene Adams, Darlene Adams MR#:  [id number] DATE OF BIRTH:  1924/08/10  DATE OF CONSULTATION:  09/21/2012  CONSULTING PHYSICIAN:  Marcina MillardAlexander Rane Blitch, MD  PRIMARY CARE PHYSICIAN: Mickie HillierJack H. Sheppard PentonWolf, MD  CHIEF COMPLAINT: Shortness of breath.   REASON FOR CONSULTATION: Consultation requested for evaluation of diastolic congestive heart failure.   HISTORY OF PRESENT ILLNESS: The patient is an 81 year old female with history of hypertension, hyperlipidemia and chronic kidney disease. The patient apparently was recently admitted a week ago with similar symptoms and diagnosis. At that time, the patient was seen by nephrology who discussed the possibility of dialysis. The patient has known normal left ventricular function with probable diastolic congestive heart failure complicated by acute on chronic renal failure. After being discharged recently, the patient went home, continued to experience fluid retention, weight gain and shortness of breath. She re-present[facility] where admission labs were notable for a BUN and creatinine of 70 and 4.92, respectively. The patient is also anemic with a hemoglobin and hematocrit 9.1 and 26.6, respectively. She was ruled out for myocardial infarction by CPK isoenzymes and troponin.   PAST MEDICAL HISTORY: 1.  Chronic kidney disease with baseline creatinine of 2.2.  2.  Diastolic congestive heart failure with known normal left ventricular function.  3.  Hypertension.  4.  Hyperlipidemia.  5.  Glaucoma.   MEDICATIONS: Aspirin 81 mg daily, pravastatin 40 mg at bedtime, metoprolol succinate 100 mg daily, amlodipine 10 mg daily, furosemide 20 mg daily, lorazepam 1 mg p.r.n., Travatan  0.004% ophthalmic solution to both eyes at bedtime, Vigamox 0.5% ophthalmic solution q.i.d. to affected eye, Ensure 240 mg t.i.d.   SOCIAL HISTORY: The patient currently lives at home with her daughter. She denies tobacco abuse.   FAMILY HISTORY: Father died status post  MI.  REVIEW OF SYSTEMS:   CONSTITUTIONAL: No fever or chills.  EYES: No blurry vision.  EARS: No hearing loss.  RESPIRATORY: The patient has shortness of breath as described above.  CARDIOVASCULAR: The patient denies chest pain.  GASTROINTESTINAL: The patient has some intermittent constipation and diarrhea.  GENITOURINARY: The patient has decreased urine output.  ENDOCRINE: No polyuria or polydipsia.  MUSCULOSKELETAL: No arthralgias or myalgias.  NEUROLOGICAL: No focal muscle weakness or numbness.  PSYCHOLOGICAL: No depression or anxiety.   PHYSICAL EXAMINATION: VITAL SIGNS: Blood pressure 123/51, pulse 61, respirations 18, temperature 97.9, pulse oximetry 92%.  HEENT: Pupils equal, reactive to light and accommodation.  NECK: Supple without thyromegaly.  LUNGS: Crackles in both bases.  HEART: Normal JVP. Normal PMI. Regular rate and rhythm. Normal S1, S2. No appreciable gallop, murmur or rub.  EXTREMITIES: There was 1+ bilateral pedal edema.  MUSCULOSKELETAL: Normal muscle tone.  NEUROLOGIC: The patient is alert and oriented x 3. Motor and sensory both grossly intact.   IMPRESSION: An 81 year old female with chronic kidney disease, who presents with acute on chronic renal failure. The patient has known hypertension and probable diastolic congestive heart failure. Therapy limited by acute on chronic renal failure. Nephrology consult pending.   RECOMMENDATIONS: 1.  Agree with overall current therapy.  2.  Would defer further cardiac diagnostics at this time.  3.  Nephrology consult pending with possible dialysis.   ____________________________ Marcina MillardAlexander Vernadine Coombs, MD ap:jm D: 09/21/2012 13:03:26 ET T: 09/21/2012 13:38:10 ET JOB#: [id number]  cc: Marcina MillardAlexander Mylena Sedberry, MD, <Dictator> Marcina MillardALEXANDER Lien Lyman MD ELECTRONICALLY SIGNED 10/13/2012 15:59

## 2014-06-09 NOTE — Consult Note (Signed)
   Comments   I met with pt's daughter. Daughter understands that pt is declining and is likely approaching the end of life. Daughter wants to limit aggressive therapy for pt including dialysis. Daughter is committed to taking pt back home at discharge where pt has been living with help from daughter, daughter's significant other and daughters adult son. I suggested hospice in the home and daughter is very interested in this. Hospice screen ordered.    Electronic Signatures: Shakelia Scrivner, Izora Gala (MD)  (Signed 05-Aug-14 16:15)  Authored: Palliative Care   Last Updated: 05-Aug-14 16:15 by Fanta Wimberley, Izora Gala (MD)

## 2014-06-09 NOTE — Discharge Summary (Signed)
PATIENT NAME:  Darlene Adams, [name] MR#:  [id number] DATE OF BIRTH:  21-Jan-1925  DATE OF ADMISSION:  09/08/2012 DATE OF DISCHARGE:  09/13/2012  DISCHARGE DIAGNOSES: 1.  Acute renal failure on chronic kidney disease due to diuretics, improving. 2.  Diastolic congestive heart failure due to valvular abnormality.  3.  Hypertension.  4.  Hyperlipidemia.  5.  Weakness.   CONDITION ON DISCHARGE: Stable.   CODE STATUS: NO CODE, DNR.   DISCHARGE MEDICATIONS: 1.  Amlodipine 10 mg once a day.  2.  Pravastatin 40 mg once a day.  3.  Lorazepam 1 mg once a day as needed.  4.  Metoprolol 100 mg once a day.  5.  Aspirin 81 mg once a day.  6.  Furosemide 20 mg once a day when body weight is 2 pounds or higher than baseline.  HOME OXYGEN ON DISCHARGE: Yes.   HOME HEALTH SERVICES: Physical therapy and nurse aide.  HOME OXYGEN: Yes, portable tank, 2 liters.   DIET ON DISCHARGE: Low sodium.   DIET SUPPLEMENT: Ensure 3 times per day.   ACTIVITY: As tolerated.   TIMEFRAME TO FOLLOW-UP:  In 1 to 2 weeks. Advised to follow with nephrology clinic within 1 to 2 weeks.   HISTORY OF PRESENT ILLNESS: The patient an 81 year old female who presented with lethargy, weakness and poor oral intake for 1 week. She was getting some lower extremity edema and came to hospital on July 20th, 3 days ago, and prescribed some Lasix and was discharged home. She was taking Lasix and her extremity edema was significantly improved, but she had become more weak and lethargic and so came to ER for further evaluation. She was noted having worsening of acute on chronic renal failure and was very dehydrated.  Chest x-ray finding was consistent with congestive heart failure.  Was clinically is not complaining of shortness of breath or chest pain. So she was admitted to hospital with generalized weakness, most likely due to dehydration and worsening renal function.   HOSPITAL COURSE AND STAY:  1.  Acute renal failure. Her baseline  creatinine was 1.4 to 1.5 and went up to 3. Was getting Lasix and not having good oral intake and that was the reason.  Nephrology consult was called in and the patient was given gentle hydration initially for a few days and then monitored her renal function daily and her kidney function came up gradually. The patient became more alert and comfortable and was discharged home with advice to follow renal function in clinic with Dr. Cherylann RatelLateef.  2.  Generalized weakness. Had poor oral intake. There was no acute source of infection. CT of the head was negative. PT consult was called in and arranged for home health aide.  3.  Dyspnea. The patient had CHF.  We continued oxygen support and gave her inhalers and discharged home on oxygen. There was no diuretic given in this admission due to her renal failure, but the patient improved significantly later on.  4.  Hypertension. We continued Toprol and Norvasc in the hospital. Blood pressure remained under control.  5.  Glaucoma. We continued Travatan eye drops.  6.  Hyperlipidemia. We continued Pravachol.   IMPORTANT LABORATORY AND DIAGNOSTICS IN THE HOSPITAL: Hemoglobin was 9.1 on presentation. On presentation creatinine was 3.12.  Chest x-ray on presentation was increased pulmonary interstitial edema. Urinalysis was grossly negative. Echocardiogram was done and it showed moderate pleural effusion in left lateral region, (Dictation Anomaly) ________ effusion. Moderate aortic valve sclerosis, calcification. Left  ventricular ejection fraction 55% to 30%.  Moderately elevated pulmonary artery systolic pressure.  Hemoglobin remained stable around 8.5 and creatinine level gradually down to 2.47 and 2.29 at time of discharge.   TOTAL TIME SPENT ON THIS DISCHARGE: 45 minutes.  ____________________________ Darlene Adams Darlene Pigeon, MD vgv:sb D: 09/16/2012 13:47:34 ET T: 09/16/2012 14:19:14 ET JOB#: [id number]  cc: Darlene Adams. Darlene Pigeon, MD, <Dictator> Darlene Adams. Darlene Penton,  MD Darlene Pippins, MD Darlene Dilling MD ELECTRONICALLY SIGNED 09/21/2012 23:18

## 2014-06-09 NOTE — H&P (Signed)
PATIENT NAME:  Darlene Adams, Darlene Adams MR#:  [id number] DATE OF BIRTH:  Jul 16, 1924  DATE OF ADMISSION:  09/21/2012  REFERRING PHYSICIAN: Enedina Finnerandolph N. Manson PasseyBrown, MD  PRIMARY CARE PHYSICIAN: Mickie HillierJack H. Sheppard PentonWolf, MD  CHIEF COMPLAINT: Shortness of breath.   HISTORY OF PRESENT ILLNESS: This is an 81 year old female who was recently [facility] due to diastolic CHF and acute on chronic renal failure. The patient presents today with shortness of breath. The patient was discharged to home on p.r.n. Lasix. The family reports the patient has decreased p.o. intake, but as well, they were monitoring her weight, where she did regain 4 pounds over the last 3 days, where they were giving her 20 mg of Lasix daily based on that, but she became significantly short of breath even though she is on 2 liters nasal cannula at home, and she has increased oxygen requirement, which prompted the family to bring her to the ED. In the ED, the patient was hypoxic on 2 liters. The patient's chest x-ray did show worsening pleural effusion and pulmonary edema, where she did receive 60 mg of IV Lasix for that, where she had improvement of her symptoms after that. As well, family reports worsening lower extremity edema with 4-pound gain. As well, the patient was found to have creatinine of 4.92, with value on discharge before 7 days was at 2.29. As well, her proBNP was significantly elevated at 36,168. The patient had bladder scan done which showed only 140 mL in bladder. The patient denies any chest pain. Complains of poor appetite, decreased p.o. intake and feeling generally weak. Denies fever, chills, urinary symptoms or cough or productive sputum. Hospitalist service was requested to admit the patient for further management and workup of her acute CHF and worsening renal function.   PAST MEDICAL HISTORY:  1. Hypertension.  2. Hyperlipidemia.  3. Glaucoma.  4. Diastolic CHF with last ejection fraction 55%.  5.  Chronic kidney disease, currently baseline creatinine around 2.2.   ALLERGIES: SULFA, WHICH CAUSES RASH.   SOCIAL HISTORY: No smoking. No alcohol use. No illicit drug use. Lives at home with her daughter.   FAMILY HISTORY: The patient's father died from complication of an MI. Mother died from old age.   HOME MEDICATIONS:  1. Aspirin 81 mg oral daily.  2. Lorazepam 1 mg oral daily as needed.  3. Pravastatin 40 mg oral at bedtime.  4. Metoprolol succinate 100 mg oral daily.  5. Norvasc 10 mg oral daily.  6. Lasix 20 mg oral daily as needed for 2 pounds' weight gain or higher from her baseline.  7. Travatan 0.004% ophthalmic solution both eyes at bedtime.  8. Vigamox 0.5% ophthalmic solution affected eye 4 times a day.  9. Ensure 240 mL 3 times a day.   REVIEW OF SYSTEMS:  CONSTITUTIONAL: The patient denies any fever or chills, but complains of generalized weakness, fatigue and poor appetite and poor p.o. intake.  EYES: Denies blurry vision, double vision. Has history of glaucoma, has poor vision at baseline.  ENT: Denies tinnitus, ear pain, epistaxis or discharge.  RESPIRATORY: Denies cough, wheezing, hemoptysis, painful respiration, COPD. Complains of shortness of breath.  CARDIOVASCULAR: Denies chest pain, arrhythmia, palpitations, syncope. Has worsening lower extremity edema.  GASTROINTESTINAL: Denies nausea, vomiting, diarrhea, abdominal pain, hematemesis, rectal bleed, jaundice.  GENITOURINARY: Denies dysuria, hematuria, renal colic.  ENDOCRINE: Denies polyuria, polydipsia, heat or cold intolerance.  HEMATOLOGY: Denies anemia, easy bruising or bleeding diathesis.  INTEGUMENTARY: Denies acne, rash or  skin lesions.   MUSCULOSKELETAL: The patient denies any history of gout. Has arthritis and limited activity at baseline.  NEUROLOGIC: Denies CVA, TIA, seizures, headache, vertigo, tremors.  PSYCHIATRIC: Has history of anxiety. Denies substance abuse, alcohol abuse, depression or  schizophrenia.   PHYSICAL EXAMINATION:  VITAL SIGNS: Temperature 98.2, pulse 76, respiratory rate 22, blood pressure 119/33, saturating 98% on oxygen.  GENERAL: Frail, cachectic, elderly female lying in bed in mild respiratory distress.  HEENT: Head atraumatic, normocephalic. Pupils equally reactive to light. Pink conjunctivae. Anicteric sclerae. Dry oral mucosa. No oral thrush.  NECK: Supple. No JVD. No bruits. No lymphadenopathy or thyromegaly.  CARDIOVASCULAR: S1, S2 heard. No rubs, murmurs or gallops. Regular rate and rhythm.  LUNGS: The patient had fair air entry bilaterally. No use of accessory muscles. Has bibasilar crackles and decreased respiratory sounds at the bases.  ABDOMEN: Soft, nontender, nondistended. Bowel sounds present. No hepatosplenomegaly could be appreciated.  EXTREMITIES: No clubbing. No cyanosis. Has +2 edema bilaterally mainly around the ankles and in feet. Radial pulses and pedal pulses +2 bilaterally.  NEUROLOGIC: The patient is awake and alert x3, coherent, intact judgment and insight.  SKIN: Normal skin turgor. Warm and dry  LYMPHATICS: No cervical or supraclavicular lymphadenopathy could be appreciated.  NEUROLOGIC: Cranial nerves grossly intact. Motor 5 out of 5 in all extremities. No focal deficits.   PERTINENT LABORATORY: BNP 36,168. Glucose 110, BUN 70, creatinine 4.92. Sodium 126, potassium 5.2, chloride 9.1. Troponin less than 0.02. White blood cells 10.2, hemoglobin 9.1, hematocrit 26.6, platelets 301. EKG showing normal sinus rhythm with left bundle branch block, which is old from last EKG. Chest x-ray: The patient has bilateral pleural effusions, worse from last chest x-ray, and significant vascular congestion.   ASSESSMENT AND PLAN:  1. Shortness of breath of breath. This is due to acute diastolic congestive heart failure. The patient's most recent echo showing EF of 55%. The patient received 60 mg of IV Lasix in the ED, with significant improvement of her  shortness of breath. The patient will be admitted to telemetry unit. Will continue to cycle her cardiac enzymes and follow the trend. Will consult cardiology service. As this point, will hold on any further diuresis given her significantly worsening renal function, especially as her breathing improved after the initial dose of Lasix. As well, will continue to monitor the patient's ins and outs and will have her weight checked daily.  2. Acute on chronic renal failure. The patient has significantly worsening creatinine, more than doubled, so at this point, will hold on the diuresis until she is seen by nephrology in a.m.  3. Hypertension. Her blood pressure is on the lower side, so will hold her home meds at this point.  4. Hyperlipidemia. Continue with statin.  5. Hyperkalemia. This is most likely related to her worsening renal function. The patient does not have any EKG changes. She already received 60 of IV Lasix, which I would expect will bring down her potassium level. Will recheck the value in a.m.  6. Deep vein thrombosis prophylaxis. Subcutaneous heparin.   CODE STATUS: The patient and family at bedside report she is do not resuscitate code status.   TOTAL TIME SPENT ON ADMISSION AND PATIENT CARE: 55 minutes.    ____________________________ Starleen Arms, MD dse:OSi D: 09/21/2012 03:29:13 ET T: 09/21/2012 06:05:32 ET JOB#: [id number]  cc: Starleen Arms, MD, <Dictator> Aarnav Steagall Teena Irani MD ELECTRONICALLY SIGNED 09/21/2012 7:11

## 2014-06-09 NOTE — Consult Note (Signed)
PATIENT NAME:  Darlene Adams, MONDRY MR#:  [id number] DATE OF BIRTH:  1925-01-26  DATE OF CONSULTATION:  09/10/2012  REFERRING PHYSICIAN: Gladstone Lighter, MD CONSULTING PHYSICIAN:  Dalinda Heidt Lilian Kapur, MD  REASON FOR CONSULTATION: Acute renal failure and chronic kidney disease, stage IV.   HISTORY OF PRESENT ILLNESS: The patient is an 81 year old Caucasian female with past medical history of hypertension, hyperlipidemia, glaucoma, chronic kidney disease, stage IV with baseline creatinine of 2.1 and an eGFR of 22, who presented to [facility] with generalized weakness. The patient is a rather poor historian and most of the history is offered by her family. It appears that she has become weak over the course of a week. She saw her primary care physician for the weakness and lower extremity edema. Subsequently, they came to the hospital. We are asked to see her for evaluation and management of acute renal failure in the setting of known chronic kidney disease, stage IV. The patient had labs checked on 08/16/2012, at which point in time creatinine was 2.1 with an eGFR of 22. On 12/27/2011, creatinine was 1.8 with an eGFR of 27. Therefore, she does have underlying chronic kidney disease, stage IV. Upon presentation, the patient's creatinine was 2.3, which was not far from her baseline. She has been provided with Lasix this admission and when creatinine became worse, she was also given IV fluids. Creatinine currently is 3.07. Urinalysis was performed, which shows urine protein of 100 mg/dL, only 2 RBCs were noted per high-power field and 3 RBCs were noted per high-power field. In addition, the patient has had rather poor p.o. intake over the course of the hospitalization per the family. This also persisted while at home. The patient states that her appetite is significantly diminished. The patient also appears to have associated anemia with a hemoglobin of 8.4 at present. She denies ingestion of  NSAIDs at home.   PAST MEDICAL HISTORY:  1.  Hypertension.  2.  Hyperlipidemia.  3.  Glaucoma.  4.  Chronic kidney disease, stage IV. baseline creatinine 2.1 with an eGFR of 22 from 08/16/2012.  5.  Tobacco snuff use.   ALLERGIES: SULFA.   CURRENT INPATIENT MEDICATIONS: Include: Acetaminophen 650 mg p.o. q.4 hours p.r.n., amlodipine 10 mg daily, aspirin 81 mg daily, heparin 5000 units subcutaneous q.8 hours, metoprolol 100 mg p.o. daily, moxifloxacin 1 drop to both eyes q.6 hours, Zofran 4 mg IV q.4 hours p.r.n., pravastatin 40 mg p.o. daily, travoprost 0.004% 1 drop to both eyes at bedtime and Ativan 0.5 mg p.o. b.i.d.   SOCIAL HISTORY: The patient resides in [place]. She resides with her daughter. She uses tobacco snuff and has for years. Denies smoking however. Denies alcohol or illicit drug use.   FAMILY HISTORY: The patient's father died secondary to myocardial infarction. The patient's mother passed away at an advanced age. No family history of end-stage renal disease.   REVIEW OF SYSTEMS: CONSTITUTIONAL: Denies fevers, chills, weight loss. EYES: Has history of glaucoma. HEENT: Denies headaches or hearing loss. Denies epistaxis. CARDIOVASCULAR: Denies chest pain, palpitations, but does report dyspnea on exertion. RESPIRATORY: Denies cough or hemoptysis, but does have shortness of breath. GASTROINTESTINAL: Reports diminished appetite, and some nausea. Denies vomiting. GENITOURINARY: Denies frequency, urgency, or dysuria. Does have history of underlying chronic kidney disease, stage IV. MUSCULOSKELETAL: Denies joint pain, swelling, or redness. INTEGUMENTARY: Denies skin rashes or lesions. NEUROLOGIC: Denies focal extremity numbness, weakness, or tingling. PSYCHIATRIC: Does have some anxiety, but denies depression. ENDOCRINE: Denies polyuria or  polydipsia. HEMATOLOGIC AND LYMPHATIC: Denies easy bruisability, bleeding, or swollen lymph nodes. ALLERGY/IMMUNOLOGIC: Denies seasonal allergies or  history of immunodeficiency.   PHYSICAL EXAMINATION:  VITAL SIGNS: Temperature 97.8, pulse 83, respirations 18, blood pressure 121/57, pulse oximetry 99% on 2 L.  GENERAL: Reveals a frail appearing, elderly female, currently in no acute distress.  HEENT: Normocephalic, atraumatic. Extraocular movements are intact. Pupils equal, round and reactive to light. No scleral icterus. Conjunctivae are pale. No epistaxis noted. Gross hearing intact. Oral mucosa is slightly dry and tongue is blackened.  NECK: Supple without JVD or lymphadenopathy.  LUNGS: Show mild basilar rales. Otherwise clear with good air entry and normal respiratory effort.  HEART: S1, S2. Periods of irregularity noted. No murmurs or rubs appreciated.  ABDOMEN: Soft, nontender, nondistended. Bowel sounds positive. No rebound or guarding. No gross organomegaly appreciated.  EXTREMITIES: Trace bilateral lower extremity edema noted.  SKIN: Warm and dry. No rashes noted.  MUSCULOSKELETAL: No joint redness, swelling or tenderness appreciated.  GENITOURINARY: No suprapubic tenderness noted at this time.  PSYCHIATRIC: The patient is with rather flat affect and limited insight into her current illness.   LABORATORY DATA: Sodium 137, potassium 4, chloride 108, CO2 28, BUN 46, creatinine 3.07, glucose 98. LFTs show total protein 6.6, albumin 3.4, total bilirubin 0.3, alkaline phosphatase 63, AST 34, ALT 38. CBC shows WBC 5.5, hemoglobin 8.4, hematocrit 24, platelets 189. Urinalysis shows urine protein of 100 mg/dL, 2 RBCs per high-power field, 3 WBCs per high-power field. A 2-D echocardiogram shows normal ejection fraction of 55% to 60%. There was moderately elevated pulmonary artery systolic pressure. There was also pseudonormal pattern of LV diastolic filling. ABG shows pH of 7.42, pCO2 of 41, pO2 of 49. Renal ultrasound shows right kidney 8.5 cm and the left kidney 8.3 cm. Both kidneys demonstrated increased echogenicity. No hydronephrosis noted.  There were multiple anechoic renal cysts noted. There was also mildly complex cystic mass in the left kidney.   IMPRESSION: This is an 81 year old Caucasian female with past medical history of hypertension, hyperlipidemia, glaucoma, chronic kidney disease stage IV with baseline creatinine 2.1 with an eGFR of 22 from 08/16/2012, who presented to [facility] with weakness, lower extremity edema and renal insufficiency. 1.  Acute renal failure/chronic kidney disease stage IV/proteinuria. The patient has known underlying chronic kidney disease. I called the patient's primary care physician's office. On 08/16/2012, creatinine was 2.1 with eGFR of 22. Prior to this, in 12/2011, creatinine was 1.8 with an eGFR of 27. The patient was initially diuresis during this admission and then given some fluids. Currently fluids are off as well as diuretic therapy. I suspect that there is some element of underlying intravascular volume depletion now. We would recommend re-initiation of hydration very gently with 0.9 normal saline at 40 mL/h. Certainly monitor for signs of volume overload. The patient is likely very preload dependent as she has underlying pulmonary hypertension. Therefore, would not to avoid volume depletion as possible. We will also obtain SPEP, UPEP, ANA and urine protein to creatinine ratio for further evaluation.  2.  Mildly complex left renal cyst. Radiology has recommended that CT scan of the abdomen and pelvis be performed in 3 months. This should be noted for the discharge plan and summary.  3.  Anemia of chronic kidney disease. Hemoglobin has drifted down over the course of the hospitalization. Hemoglobin currently 8.4. We could consider Procrit therapy, but for now we will monitor blood counts. We will also check serum iron studies.  4.  Lower extremity edema. This has significantly improved and was only found to be trace on exam at present. Would avoid diuretic therapy for now.   5.  I would like to thank Dr. Tressia Miners for this kind referral. Further plans as the patient progresses. ____________________________ Tama High, MD mnl:aw D: 09/10/2012 15:13:47 ET T: 09/10/2012 15:38:16 ET JOB#: [id number]  cc: Tama High, MD, <Dictator> Mariah Milling Keonia Pasko MD ELECTRONICALLY SIGNED 09/20/2012 11:21

## 2014-06-09 NOTE — Discharge Summary (Signed)
PATIENT NAME:  Darlene Adams, Darlene Adams MR#:  [id number] DATE OF BIRTH:  [birth year]  PRIMARY CARE PHYSICIAN: Dr. Cay Schillings.  CONSULTATIONS: Palliative care, Dr. Harvie Junior; cardiology, Dr. Darrold Junker; nephrology, Dr. Cherylann Ratel; pulmonary, Dr. Belia Heman.   DISCHARGE DIAGNOSES:  1.  Acute diastolic congestive heart failure, ejection fraction 55%.  2.  Acute-on-chronic respiratory failure.  3.  Acute-on-chronic renal failure.  4.  Hyperkalemia.  5.  Hypertension.  6.  Anemia.   CONDITION: Poor.   CODE STATUS: DNR.   HOME MEDICATIONS:  1.  Norvasc 10 mg p.o. daily.  2.  Pravastatin 40 mg p.o. at bedtime.  3.  Lorazepam 1 mg p.o. once a day p.r.n. 4.  Lopressor 100 mg p.o. daily.  5.  Vigamox 0.5% ophthalmic solution one drop to each affected eye 4 times a day.  6.  Travatan 0.004% ophthalmic solution one drop to each affected eye once a day in the evening.  7.  Aspirin 81 mg p.o. daily.  8.  Ensure t.i.d.  9.  The patient need home oxygen 2 to 3 liters by nasal catheter.   DIET: Low sodium diet.   ACTIVITY: As tolerated.   FOLLOW-UP CARE: Follow up with PCP one to two weeks.   DISPOSITION: The patient will be discharged to home with hospice.   REASON FOR ADMISSION: Shortness of breath.   HOSPITAL COURSE: The patient is an 81 year old Caucasian female with a history of hypertension, CHF, CKD who was sent to the ED due to shortness of breath. The patient gained  4 pounds over 3 days. She was given Lasix but still has shortness of breath even on 2 liters of oxygen by nasal cannula.   In the ED, the patient's chest x-ray showed worsening pleural effusion with pulmonary edema.  She received 60 mg IV Lasix, had some improvement of symptoms, and admitted for CHF decompensation. For detailed history and physical examination, please refer to the admission note dictated by Dr. Randol Kern.   On admission date, laboratory data showed BNP 36,168, a glucose 110, BUN 17, creatinine 4.92, sodium 126, potassium 5.2,  chloride 9.1. Troponin less than 0.02. Hemoglobin 9.1, white count 10.2.   A chest x-ray showed bilateral pleural effusions, worse from the last chest x-ray with significant congestive vascular congestion.   ASSESSMENT AND PLAN:  1.  Acute-on-chronic congestive heart failure, diastolic dysfunction, with ejection fraction 55%. The patient received the Lasix in the ED, but the Lasix was on hold due to acute renal failure. Dr. Darrold Junker and Dr. Cherylann Ratel evaluated the patient and suggested to hold the Lasix and continue oxygen and other medications.  2.  Acute renal failure. The patient has an increased BUN and creatinine, so Lasix was on hold. The patient's BUN increased to 79. Creatinine increased to 5.2.  3.  Hypertension. The patient's blood pressure has been on lower side so will hold hypertension medication.  4.  Hyperkalemia. The patient developed one episode of hyperkalemia, was treated with Lasix and decreased to normal range.  5.  The patient has severe medical problems as mentioned above, and Dr. Harvie Junior evaluated the patient,  discussed with the patient's daughter, and then they decided hospice care. The patient was discharged to home with hospice care yesterday. I discussed the patient's discharge plan with the patient, Dr. Harvie Junior, case manager and nurse.   TIME SPENT: About 38 minutes.   ____________________________ Shaune Pollack, MD qc:np D: 09/24/2012 14:58:52 ET T: 09/24/2012 15:52:03 ET JOB#: [id number]  cc: Shaune Pollack, MD, <Dictator> [facility]  MD ELECTRONICALLY SIGNED 09/27/2012 22:19

## 2014-06-17 IMAGING — CR DG CHEST 1V PORT
1 series · 1 of 1 positions shown · non-contrast
Comparison: none

REASON FOR EXAM: SOB
COMMENTS:

[ap]
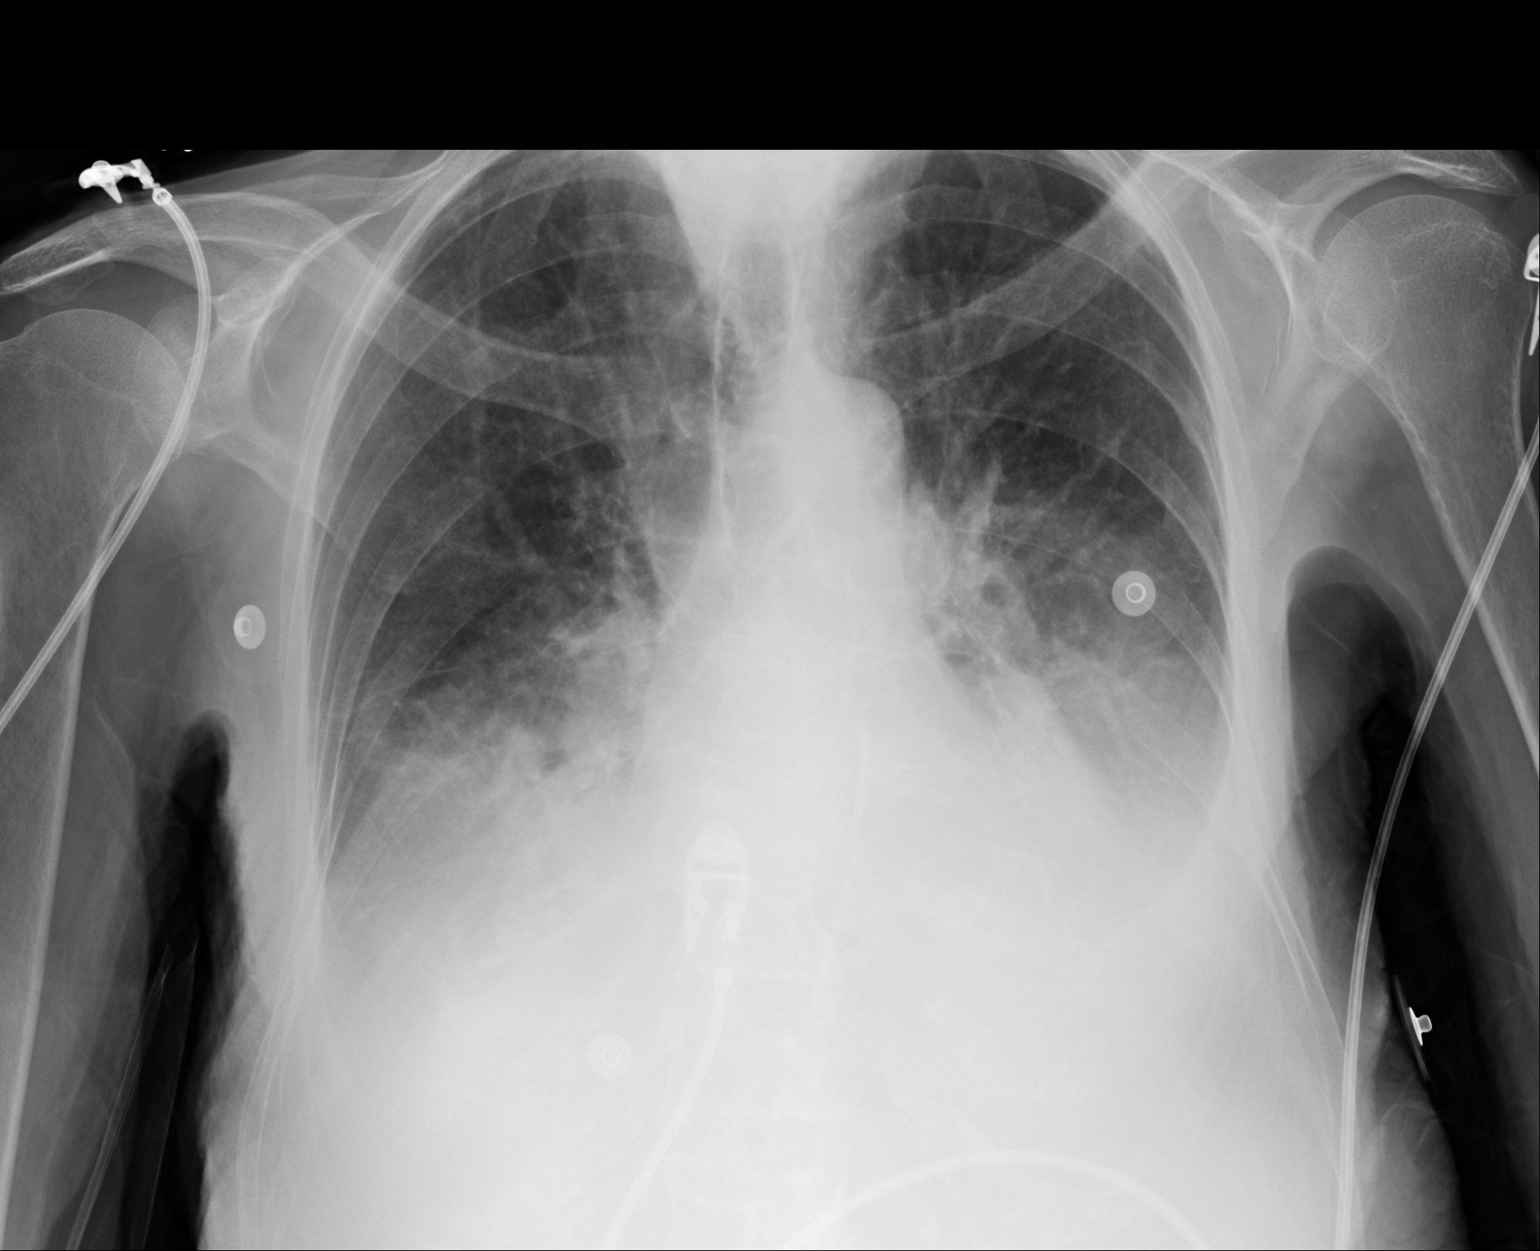

[1 of 1 positions shown; findings below may reference images not displayed]

PROCEDURE:     DXR - DXR PORTABLE CHEST SINGLE VIEW  - September 21, 2012 [DATE]

RESULT:     Comparison is made to the study dated 09/11/2012. There are
bilateral pleural effusions that appear to be moderately large on the left
and small on the right. Heart size is difficult to assess. There is no
pneumothorax. Lung base atelectasis or infiltrate is likely present.
IMPRESSION: Persistent bilateral pleural effusions, larger on the left.

[REDACTED]
